# Patient Record
Sex: Female | Born: 1965 | Race: White | Hispanic: No | Marital: Single | Smoking: Current every day smoker
Health system: Southern US, Community
[De-identification: ages and names within clinical notes are randomized; demographics above are authoritative.]

## PROBLEM LIST (undated history)

## (undated) DIAGNOSIS — E785 Hyperlipidemia, unspecified: Secondary | ICD-10-CM

## (undated) DIAGNOSIS — K219 Gastro-esophageal reflux disease without esophagitis: Secondary | ICD-10-CM

## (undated) DIAGNOSIS — Z87442 Personal history of urinary calculi: Secondary | ICD-10-CM

## (undated) DIAGNOSIS — F419 Anxiety disorder, unspecified: Secondary | ICD-10-CM

## (undated) DIAGNOSIS — Z8489 Family history of other specified conditions: Secondary | ICD-10-CM

## (undated) HISTORY — DX: Anxiety disorder, unspecified: F41.9

## (undated) HISTORY — PX: OTHER SURGICAL HISTORY: SHX169

## (undated) HISTORY — DX: Gastro-esophageal reflux disease without esophagitis: K21.9

## (undated) HISTORY — PX: BUNIONECTOMY: SHX129

---

## 1998-04-07 ENCOUNTER — Emergency Department (HOSPITAL_COMMUNITY): Admission: EM | Admit: 1998-04-07 | Discharge: 1998-04-07 | Payer: Self-pay | Admitting: Emergency Medicine

## 1998-04-24 ENCOUNTER — Ambulatory Visit (HOSPITAL_COMMUNITY): Admission: RE | Admit: 1998-04-24 | Discharge: 1998-04-24 | Payer: Self-pay | Admitting: Urology

## 1998-04-24 ENCOUNTER — Encounter: Payer: Self-pay | Admitting: Urology

## 2001-04-20 ENCOUNTER — Encounter: Admission: RE | Admit: 2001-04-20 | Discharge: 2001-04-20 | Payer: Self-pay | Admitting: Family Medicine

## 2001-04-20 ENCOUNTER — Encounter: Payer: Self-pay | Admitting: Family Medicine

## 2001-07-26 HISTORY — PX: BREAST BIOPSY: SHX20

## 2008-03-06 ENCOUNTER — Ambulatory Visit (HOSPITAL_COMMUNITY): Admission: RE | Admit: 2008-03-06 | Discharge: 2008-03-06 | Payer: Self-pay | Admitting: Family Medicine

## 2010-08-17 ENCOUNTER — Encounter: Payer: Self-pay | Admitting: Family Medicine

## 2012-12-24 ENCOUNTER — Other Ambulatory Visit: Payer: Self-pay | Admitting: Physician Assistant

## 2012-12-25 NOTE — Telephone Encounter (Signed)
Medication refilled per protocol.Patient needs to be seen before any further refills  Tried to call pt, unable to reach

## 2013-02-21 ENCOUNTER — Encounter: Payer: Self-pay | Admitting: Physician Assistant

## 2013-02-21 ENCOUNTER — Ambulatory Visit (INDEPENDENT_AMBULATORY_CARE_PROVIDER_SITE_OTHER): Payer: BC Managed Care – PPO | Admitting: Physician Assistant

## 2013-02-21 VITALS — BP 106/70 | HR 76 | Temp 98.2°F | Resp 18 | Ht 63.25 in | Wt 152.0 lb

## 2013-02-21 DIAGNOSIS — F419 Anxiety disorder, unspecified: Secondary | ICD-10-CM | POA: Insufficient documentation

## 2013-02-21 DIAGNOSIS — F411 Generalized anxiety disorder: Secondary | ICD-10-CM

## 2013-02-21 DIAGNOSIS — K219 Gastro-esophageal reflux disease without esophagitis: Secondary | ICD-10-CM | POA: Insufficient documentation

## 2013-02-21 MED ORDER — ESCITALOPRAM OXALATE 10 MG PO TABS: 10.0000 mg | ORAL_TABLET | Freq: Every day | ORAL | Status: DC

## 2013-02-21 MED ORDER — ALPRAZOLAM 0.5 MG PO TABS: 0.5000 mg | ORAL_TABLET | ORAL | Status: DC | PRN

## 2013-02-21 MED ORDER — OMEPRAZOLE 20 MG PO CPDR: 20.0000 mg | DELAYED_RELEASE_CAPSULE | Freq: Every day | ORAL | Status: DC

## 2013-02-22 NOTE — Progress Notes (Signed)
Patient ID: Isabel Hines MRN: [id number], DOB: Aug 02, 1965, 47 y.o. Date of Encounter: @DATE @  Chief Complaint:  Chief Complaint  Patient presents with  . routine check up for med refills    HPI: 47 y.o. year old white pleasant  female  presents for f/u of anxiety.   I reviewed my OV note from 10/11/2005. At that visit, we discussed that she had been on med for anxiety/depression off and on at different times throughout the years. At OV 10/11/05 started Lexapro 10mg . At f/u she was feeling much better on lexapro. Then, she went without f/u of that for a long time.  Then, when she came for OV 08/02/12 she reported she had recently gone through a divorce and moved. Had been on Lexapro for about 6 months during that time, but then had stopped it b/c did not want to be on daily med. At that OV, she said she was stable off the Lexapro. Was given xanx to use prn. (#30).  Then, she called 09/14/12 requesting Lexapro. I did prescribe it.  Today she says she called 09/14/12 because she was feeling increased anxiety/stress because of her job. Says htat with her previous job, she had migraines. Went to headache center, tried multiple treatments, etc. The only thing that ended up getting rid of her headaches was changing jobs. It was her job taht was causing her headache. She says that her job has changed recently-has added a lot more responsibility and stress. She works for [employer] but now in [employer]. There.  A co-worker left the job, that co-worker was not replaced. 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 Now her headache is present again. She would leave this job but she cant b/c she has to have the money. She will not be able to find another job making the money she makes there.  She says she called 09/14/12 b/c she "couldn't take it, couldn't sleep b/c mind racing,stresssed." I called in hte Lexapro 10mg . She is taking this Daily. This controls her anxiety very  well. She does not feel she need to increase dose. She had no adv effects.  She has had no xanax for past 2 months. Would like to have it available just in case needs it.   She has recently been having GERD. Taking Prilosec OTC with complete relief. No symptoms with med.    Past Medical History  Diagnosis Date  . Anxiety   . GERD (gastroesophageal reflux disease)      Home Meds: See attached medication section for current medication list. Any medications entered into computer today will not appear on this note's list. The medications listed below were entered prior to today. No current outpatient prescriptions on file prior to visit.   No current facility-administered medications on file prior to visit.    Allergies:  Allergies  Allergen Reactions  . Codeine Hives  . Sulfa Antibiotics Hives  . Tramadol Hives    History   Social History  . Marital Status: Married    Spouse Name: N/A    Number of Children: N/A  . Years of Education: N/A   Occupational History  . Not on file.   Social History Main Topics  . Smoking status: Not on file  . Smokeless tobacco: Not on file  . Alcohol Use: Not on file  . Drug Use: Not on file  . Sexually Active: Not on file   Other Topics Concern  .  Not on file   Social History Narrative  . No narrative on file    History reviewed. No pertinent family history.   Review of Systems:  See HPI for pertinent ROS. All other ROS negative.    Physical Exam: Blood pressure 106/70, pulse 76, temperature 98.2 F (36.8 C), temperature source Oral, resp. rate 18, height 5' 3.25" (1.607 m), weight 152 lb (68.947 kg)., Body mass index is 26.7 kg/(m^2). General: WNWD WF. Pleasant. Appears in no acute distress. Lungs: Clear bilaterally to auscultation without wheezes, rales, or rhonchi. Breathing is unlabored. Heart: RRR with S1 S2. No murmurs, rubs, or gallops. Abdomen: Soft, non-tender, non-distended with normoactive bowel sounds. No  hepatomegaly. No rebound/guarding. No obvious abdominal masses. Musculoskeletal:  Strength and tone normal for age. Extremities/Skin: Warm and dry.  No edema. Neuro: Alert and oriented X 3. Moves all extremities spontaneously. Gait is normal. CNII-XII grossly in tact. Psych:  Responds to questions appropriately with a normal affect.Good eye contact. Very appropriat. Not anxious during visit. Very calm/appropriate.      ASSESSMENT AND PLAN:  47 y.o. year old female with  1. Anxiety She really needs to stay on Lexapro. After review of her chart, this is recurrent over many years. I will encourage her to stay on Lexapro indefinitely. - escitalopram (LEXAPRO) 10 MG tablet; Take 1 tablet (10 mg total) by mouth daily.  Dispense: 30 tablet; Refill: 5 - ALPRAZolam (XANAX) 0.5 MG tablet; Take 1 tablet (0.5 mg total) by mouth as needed for sleep.  Dispense: 30 tablet; Refill: 0  2. GERD (gastroesophageal reflux disease) - omeprazole (PRILOSEC) 20 MG capsule; Take 1 capsule (20 mg total) by mouth daily.  Dispense: 30 capsule; Refill: 5 Discussed diet, foods to avoid. Discussed no eating/drinking within 3 hours prior to lying down.   ROV 6 months, sooner if needed.   Murray Hodgkins [place], [place], [facility] 02/22/2013 7:49 AM

## 2013-04-03 ENCOUNTER — Encounter: Payer: Self-pay | Admitting: Family Medicine

## 2013-04-03 ENCOUNTER — Ambulatory Visit (INDEPENDENT_AMBULATORY_CARE_PROVIDER_SITE_OTHER): Payer: BC Managed Care – PPO | Admitting: Family Medicine

## 2013-04-03 VITALS — BP 104/62 | HR 68 | Temp 98.3°F | Resp 14 | Ht 66.0 in | Wt 152.0 lb

## 2013-04-03 DIAGNOSIS — J019 Acute sinusitis, unspecified: Secondary | ICD-10-CM

## 2013-04-03 MED ORDER — FLUCONAZOLE 150 MG PO TABS: 150.0000 mg | ORAL_TABLET | Freq: Once | ORAL | Status: DC

## 2013-04-03 MED ORDER — AMOXICILLIN-POT CLAVULANATE 875-125 MG PO TABS: 1.0000 | ORAL_TABLET | Freq: Two times a day (BID) | ORAL | Status: DC

## 2013-04-03 NOTE — Progress Notes (Signed)
Subjective:    Patient ID: Isabel Hines, female    DOB: [birth year], 47 y.o.   MRN: [id number]  HPI Patient is a very pleasant 47 year old white female who is battling upper respiratory infection for over 2 weeks. Unfortunately over the last week she started developing fever severe pain in her right and left maxillary sinuses, postnasal drip, sinus pain, sinus pressure, and sinus headaches. She is also having a scratchy throat due to postnasal drip and a cough productive of yellow and green sputum. Past Medical History  Diagnosis Date  . Anxiety   . GERD (gastroesophageal reflux disease)    No past surgical history on file. Current Outpatient Prescriptions on File Prior to Visit  Medication Sig Dispense Refill  . ALPRAZolam (XANAX) 0.5 MG tablet Take 1 tablet (0.5 mg total) by mouth as needed for sleep.  30 tablet  0  . cetirizine (ZYRTEC) 10 MG tablet Take 10 mg by mouth daily.      . cholecalciferol (VITAMIN D) 1000 UNITS tablet Take 1,000 Units by mouth once a week.      . escitalopram (LEXAPRO) 10 MG tablet Take 1 tablet (10 mg total) by mouth daily.  30 tablet  5  . Multiple Vitamin (MULTIVITAMIN) tablet Take 1 tablet by mouth daily.      Marland Kitchen omeprazole (PRILOSEC) 20 MG capsule Take 1 capsule (20 mg total) by mouth daily.  30 capsule  5   No current facility-administered medications on file prior to visit.   Allergies  Allergen Reactions  . Codeine Hives  . Sulfa Antibiotics Hives  . Tramadol Hives   History   Social History  . Marital Status: Married    Spouse Name: N/A    Number of Children: N/A  . Years of Education: N/A   Occupational History  . Not on file.   Social History Main Topics  . Smoking status: Current Every Day Smoker -- 1.00 packs/day    Types: Cigarettes  . Smokeless tobacco: Not on file  . Alcohol Use: No  . Drug Use: No  . Sexual Activity: Not on file   Other Topics Concern  . Not on file   Social History Narrative  . No narrative on file       Review of Systems  All other systems reviewed and are negative.       Objective:   Physical Exam  Vitals reviewed. Constitutional: She appears well-developed and well-nourished.  HENT:  Right Ear: Tympanic membrane, external ear and ear canal normal.  Left Ear: Tympanic membrane, external ear and ear canal normal.  Nose: Mucosal edema and rhinorrhea present. Right sinus exhibits maxillary sinus tenderness and frontal sinus tenderness. Left sinus exhibits no maxillary sinus tenderness and no frontal sinus tenderness.  Mouth/Throat: Oropharynx is clear and moist. No oropharyngeal exudate.  Neck: Neck supple. No thyromegaly present.  Cardiovascular: Normal rate, regular rhythm and normal heart sounds.   No murmur heard. Pulmonary/Chest: Effort normal and breath sounds normal. No respiratory distress. She has no wheezes. She has no rales.  Abdominal: Soft. Bowel sounds are normal. She exhibits no distension. There is no tenderness. There is no rebound.  Lymphadenopathy:    She has no cervical adenopathy.          Assessment & Plan:  1. Acute rhinosinusitis The patient had a viral upper respiratory infection which has become a secondary case of rhinosinusitis.  Begin Augmentin 875 mg by mouth twice a day for 10 days. I gave patient prescription  for Diflucan 150 mg by mouth x1 if needed for a yeast infection she had one develop. She is recommended to use Sudafed and nasal saline qid for congestion. - amoxicillin-clavulanate (AUGMENTIN) 875-125 MG per tablet; Take 1 tablet by mouth 2 (two) times daily.  Dispense: 20 tablet; Refill: 0 - fluconazole (DIFLUCAN) 150 MG tablet; Take 1 tablet (150 mg total) by mouth once.  Dispense: 1 tablet; Refill: 0

## 2013-04-09 ENCOUNTER — Telehealth: Payer: Self-pay | Admitting: Family Medicine

## 2013-04-09 ENCOUNTER — Other Ambulatory Visit: Payer: Self-pay | Admitting: Family Medicine

## 2013-04-09 MED ORDER — PREDNISONE 20 MG PO TABS: ORAL_TABLET | ORAL | Status: DC

## 2013-04-09 NOTE — Telephone Encounter (Signed)
Patient aware.

## 2013-04-09 NOTE — Telephone Encounter (Signed)
Can we call out prednisone dose pack.  I will e-scribe.

## 2013-04-09 NOTE — Telephone Encounter (Signed)
Pt said that she is no better. She has tried mucinex, but it is not helping. She said that she has had this one other time and they gave her prednisone and it seemed to really help. Pt wants to know if you can send that in for her or if you can send something else in for her.

## 2013-06-06 ENCOUNTER — Ambulatory Visit (INDEPENDENT_AMBULATORY_CARE_PROVIDER_SITE_OTHER): Payer: BC Managed Care – PPO | Admitting: Physician Assistant

## 2013-06-06 ENCOUNTER — Encounter: Payer: Self-pay | Admitting: Physician Assistant

## 2013-06-06 VITALS — BP 120/72 | HR 72 | Temp 99.3°F | Resp 18 | Wt 158.0 lb

## 2013-06-06 DIAGNOSIS — F419 Anxiety disorder, unspecified: Secondary | ICD-10-CM

## 2013-06-06 DIAGNOSIS — A499 Bacterial infection, unspecified: Secondary | ICD-10-CM

## 2013-06-06 DIAGNOSIS — B9689 Other specified bacterial agents as the cause of diseases classified elsewhere: Secondary | ICD-10-CM

## 2013-06-06 DIAGNOSIS — F411 Generalized anxiety disorder: Secondary | ICD-10-CM

## 2013-06-06 DIAGNOSIS — J988 Other specified respiratory disorders: Secondary | ICD-10-CM

## 2013-06-06 MED ORDER — AZITHROMYCIN 250 MG PO TABS: ORAL_TABLET | ORAL | Status: DC

## 2013-06-06 MED ORDER — ALPRAZOLAM 0.5 MG PO TABS: 0.5000 mg | ORAL_TABLET | ORAL | Status: DC | PRN

## 2013-06-06 NOTE — Progress Notes (Signed)
Patient ID: Isabel Hines MRN: [id number], DOB: 02-05-1966, 47 y.o. Date of Encounter: 06/06/2013, 4:00 PM    Chief Complaint:  Chief Complaint  Patient presents with  . Cough, chest congestion, sinus drainage and congestion     HPI: 47 y.o. year old white female says she has been sick for over one week. Has lots of pain in her face area. Is getting out thick green mucus. Has a tickle in her throat. No chest congestion. Has had no known fevers or chills. No significant ear pain and no sore throat.     Home Meds: See attached medication section for any medications that were entered at today's visit. The computer does not put those onto this list.The following list is a list of meds entered prior to today's visit.   Current Outpatient Prescriptions on File Prior to Visit  Medication Sig Dispense Refill  . cetirizine (ZYRTEC) 10 MG tablet Take 10 mg by mouth daily.      . cholecalciferol (VITAMIN D) 1000 UNITS tablet Take 1,000 Units by mouth once a week.      . escitalopram (LEXAPRO) 10 MG tablet Take 1 tablet (10 mg total) by mouth daily.  30 tablet  5  . Multiple Vitamin (MULTIVITAMIN) tablet Take 1 tablet by mouth daily.      Marland Kitchen omeprazole (PRILOSEC) 20 MG capsule Take 1 capsule (20 mg total) by mouth daily.  30 capsule  5   No current facility-administered medications on file prior to visit.    Allergies:  Allergies  Allergen Reactions  . Augmentin [Amoxicillin-Pot Clavulanate] Itching    Also had abnormal tightness in chest but pt not sure if this was caused by Augmentin for certain, but says it felt similar to other allergic reactions that affected chest.  . Codeine Hives  . Sulfa Antibiotics Hives  . Tramadol Hives      Review of Systems: See HPI for pertinent ROS. All other ROS negative.    Physical Exam: Blood pressure 120/72, pulse 72, temperature 99.3 F (37.4 C), temperature source Oral, resp. rate 18, weight 158 lb (71.668 kg)., Body mass index is 25.51  kg/(m^2). General:  WNWD WF. Appears in no acute distress. HEENT: Normocephalic, atraumatic, eyes without discharge, sclera non-icteric, nares are without discharge. Bilateral auditory canals clear, TM's are without perforation, pearly grey and translucent with reflective cone of light bilaterally. Oral cavity moist, posterior pharynx without exudate, erythema, peritonsillar abscess. Minimal tenderness with percussion of frontal and maxillary sinuses.  Neck: Supple. No thyromegaly. No lymphadenopathy. Lungs: Clear bilaterally to auscultation without wheezes, rales, or rhonchi. Breathing is unlabored. Heart: Regular rhythm. No murmurs, rubs, or gallops. Msk:  Strength and tone normal for age. Extremities/Skin: Warm and dry. No clubbing or cyanosis. No edema. No rashes or suspicious lesions. Neuro: Alert and oriented X 3. Moves all extremities spontaneously. Gait is normal. CNII-XII grossly in tact. Psych:  Responds to questions appropriately with a normal affect.     ASSESSMENT AND PLAN:  47 y.o. year old female with  1. Bacterial respiratory infection She has had reaction with Augmentin in the past so we'll avoid this. Complete all of the azithromycin. Recommend over-the-counter antitussives/cough suppressant. Also over-the-counter decongestant. Followup if symptoms do not resolve after completion of medication. - azithromycin (ZITHROMAX) 250 MG tablet; Day 1: Take 2 daily.   Days 2-5: Take one daily.  Dispense: 6 tablet; Refill: 0  2. Anxiety She requests a refill on her Xanax. This is she only has about 3  pills remaining. She usually just takes a half of a pill at a time and only needs it about once per week. She has gone to a divorce and is having a lot of stress. Says this is improving and her Lexapro is controlling this otherwise. - ALPRAZolam (XANAX) 0.5 MG tablet; Take 1 tablet (0.5 mg total) by mouth as needed for sleep.  Dispense: 30 tablet; Refill: 1   Signed, [address] [place], [place],  [facility] 06/06/2013 4:00 PM

## 2013-06-06 NOTE — Addendum Note (Signed)
Addended by: Legrand Rams B on: 06/06/2013 04:14 PM   Modules accepted: Orders

## 2013-09-03 ENCOUNTER — Telehealth: Payer: Self-pay | Admitting: Family Medicine

## 2013-09-03 DIAGNOSIS — Z20828 Contact with and (suspected) exposure to other viral communicable diseases: Secondary | ICD-10-CM

## 2013-09-03 NOTE — Telephone Encounter (Signed)
Pt has had exposure to flu through work [employer](Boss and several coworkers have been dx with flu) and would like Tamiflu called in if possible.

## 2013-09-03 NOTE — Telephone Encounter (Signed)
Confirm whether she is asymptomatic or not. If asymptomatic and the dosing is: Tamiflu 75mg  one po QD x 10 days # 10 + 0

## 2013-09-04 MED ORDER — OSELTAMIVIR PHOSPHATE 75 MG PO CAPS: 75.0000 mg | ORAL_CAPSULE | Freq: Every day | ORAL | Status: DC

## 2013-09-04 NOTE — Telephone Encounter (Signed)
Even if she is asymptomatic, she can go ahead and take the Preventive dose, which is what I have typed out for the daily x10 days. If she waits and calls us when she has symptoms then that is the treatment dose, which is twice a day for 5 days

## 2013-09-04 NOTE — Telephone Encounter (Signed)
Not symptomatic at this time.  Just has been exposed.  Told if become symptomatic to call us ASAP and we can call Tamiflu in for her.  Important to catch in the first 24-48 hours

## 2013-09-04 NOTE — Telephone Encounter (Signed)
RX sent to pharmacy.  Left patient message.

## 2013-09-05 ENCOUNTER — Ambulatory Visit: Payer: BC Managed Care – PPO | Admitting: Physician Assistant

## 2013-09-19 ENCOUNTER — Ambulatory Visit (INDEPENDENT_AMBULATORY_CARE_PROVIDER_SITE_OTHER): Payer: BC Managed Care – PPO | Admitting: Physician Assistant

## 2013-09-19 ENCOUNTER — Encounter: Payer: Self-pay | Admitting: Physician Assistant

## 2013-09-19 VITALS — BP 114/70 | HR 68 | Temp 97.1°F | Resp 18 | Ht 63.5 in | Wt 164.0 lb

## 2013-09-19 DIAGNOSIS — B9689 Other specified bacterial agents as the cause of diseases classified elsewhere: Secondary | ICD-10-CM

## 2013-09-19 DIAGNOSIS — J45901 Unspecified asthma with (acute) exacerbation: Secondary | ICD-10-CM

## 2013-09-19 DIAGNOSIS — A499 Bacterial infection, unspecified: Secondary | ICD-10-CM

## 2013-09-19 DIAGNOSIS — J988 Other specified respiratory disorders: Secondary | ICD-10-CM

## 2013-09-19 MED ORDER — GUAIFENESIN-CODEINE 100-10 MG/5ML PO SYRP
5.0000 mL | ORAL_SOLUTION | Freq: Three times a day (TID) | ORAL | Status: DC | PRN
Start: 2013-09-19 — End: 2014-04-11

## 2013-09-19 MED ORDER — ALBUTEROL SULFATE HFA 108 (90 BASE) MCG/ACT IN AERS: 2.0000 | INHALATION_SPRAY | Freq: Four times a day (QID) | RESPIRATORY_TRACT | Status: DC | PRN

## 2013-09-19 MED ORDER — AZITHROMYCIN 250 MG PO TABS: ORAL_TABLET | ORAL | Status: DC

## 2013-09-19 NOTE — Progress Notes (Signed)
Patient ID: Isabel Hines MRN: [id number], DOB: [birth year], 48 y.o. Date of Encounter: 09/19/2013, 4:04 PM    Chief Complaint:  Chief Complaint  Patient presents with  . sick x 1 week    harsh cough, body aches, congestion, just came off Tamiflu     HPI: 48 y.o. year old white female reports that her boss and 3 coworkers recently had the flu. Therefore patient took Tamiflu as preventive. Her current symptoms started last Thursday which was 09/13/13. Started with nasal congestion. Then developed cough with a lot of tickle in her throat. Since then has developed increased amount of head and chest congestion. is only getting 2 or 3 hours of sleep secondary to cough. The other day a coworker gave her a dose of Cheratussin to get her to stop coughing. Patient states this worked very well and caused no side effects. Is requesting a prescription of this because otherwise she cannot stop coughing secondary to repeated tickle in her throat. Reports that she is allergic to cats. States that coworker has recently had a cat  At the office (??weird!!)-- says her chest has felt a little bit tight like it had in the past when she has had problems with allergies and asthma. Says in the past she had an inhaler to use as needed and needs a new prescription for this.     Home Meds: See attached medication section for any medications that were entered at today's visit. The computer does not put those onto this list.The following list is a list of meds entered prior to today's visit.   Current Outpatient Prescriptions on File Prior to Visit  Medication Sig Dispense Refill  . ALPRAZolam (XANAX) 0.5 MG tablet Take 1 tablet (0.5 mg total) by mouth as needed for sleep.  30 tablet  1  . cetirizine (ZYRTEC) 10 MG tablet Take 10 mg by mouth daily.      . cholecalciferol (VITAMIN D) 1000 UNITS tablet Take 1,000 Units by mouth once a week.      . escitalopram (LEXAPRO) 10 MG tablet Take 1 tablet (10 mg total) by  mouth daily.  30 tablet  5  . Multiple Vitamin (MULTIVITAMIN) tablet Take 1 tablet by mouth daily.      Marland Kitchen omeprazole (PRILOSEC) 20 MG capsule Take 1 capsule (20 mg total) by mouth daily.  30 capsule  5   No current facility-administered medications on file prior to visit.    Allergies:  Allergies  Allergen Reactions  . Augmentin [Amoxicillin-Pot Clavulanate] Itching    Also had abnormal tightness in chest but pt not sure if this was caused by Augmentin for certain, but says it felt similar to other allergic reactions that affected chest.  . Codeine Hives  . Sulfa Antibiotics Hives  . Tramadol Hives      Review of Systems: See HPI for pertinent ROS. All other ROS negative.    Physical Exam: Blood pressure 114/70, pulse 68, temperature 97.1 F (36.2 C), temperature source Oral, resp. rate 18, height 5' 3.5" (1.613 m), weight 164 lb (74.39 kg)., Body mass index is 28.59 kg/(m^2). General:  WNWD WF. Appears in no acute distress. HEENT: Normocephalic, atraumatic, eyes without discharge, sclera non-icteric, nares are without discharge. Bilateral auditory canals clear, TM's are without perforation, pearly grey and translucent with reflective cone of light bilaterally. Oral cavity moist, posterior pharynx without exudate, erythema, peritonsillar abscess. No tenderness of frontal or maxillary sinuses with percussion.  Neck: Supple. No thyromegaly. No lymphadenopathy.  Lungs: Clear bilaterally to auscultation without wheezes, rales, or rhonchi. Breathing is unlabored. Currently lungs are clear with no wheezes. Heart: Regular rhythm. No murmurs, rubs, or gallops. Msk:  Strength and tone normal for age. Extremities/Skin: Warm and dry. Neuro: Alert and oriented X 3. Moves all extremities spontaneously. Gait is normal. CNII-XII grossly in tact. Psych:  Responds to questions appropriately with a normal affect.     ASSESSMENT AND PLAN:  48 y.o. year old female with  1. Bacterial respiratory  infection - azithromycin (ZITHROMAX) 250 MG tablet; Day 1: Take 2 daily. Days 2-5: Take 1 daily  Dispense: 6 tablet; Refill: 0 - guaiFENesin-codeine (CHERATUSSIN AC) 100-10 MG/5ML syrup; Take 5 mLs by mouth 3 (three) times daily as needed for cough.  Dispense: 120 mL; Refill: 0  2. Asthma with acute exacerbation - albuterol (PROVENTIL HFA;VENTOLIN HFA) 108 (90 BASE) MCG/ACT inhaler; Inhale 2 puffs into the lungs every 6 (six) hours as needed for wheezing or shortness of breath.  Dispense: 1 Inhaler; Refill: 0  Followup is symptoms worsen significantly or if symptoms do not resolve within one week after completion of antibiotic. I noted that her allergy list includes codeine and noted that the Cheratussin includes codeine. Discussed this with the patient. As stated in history of present illness, she recently took this and had no adverse effects. She is aware that it does involve codeine and if it causes any adverse affects to stop it immediately.   Murray HodgkinsSigned, [place], [place], [facility] 09/19/2013 4:04 PM

## 2013-09-21 ENCOUNTER — Other Ambulatory Visit: Payer: Self-pay | Admitting: Physician Assistant

## 2013-09-21 NOTE — Telephone Encounter (Signed)
Medication refilled per protocol. 

## 2013-10-20 ENCOUNTER — Other Ambulatory Visit: Payer: Self-pay | Admitting: Physician Assistant

## 2013-10-22 NOTE — Telephone Encounter (Signed)
Medication refilled per protocol. 

## 2014-02-01 ENCOUNTER — Telehealth: Payer: Self-pay | Admitting: Physician Assistant

## 2014-02-01 DIAGNOSIS — F419 Anxiety disorder, unspecified: Secondary | ICD-10-CM

## 2014-02-01 NOTE — Telephone Encounter (Signed)
Forward to PCP.

## 2014-02-01 NOTE — Telephone Encounter (Signed)
Ok to refill??  Last office visit 09/19/2013.  Last refill 05/30/2013.

## 2014-02-01 NOTE — Telephone Encounter (Signed)
Call back number is [phone] Pharmacy [facility] [address] [place]  Pt is needing a refill on ALPRAZolam Prudy Feeler(XANAX) 0.5 MG tablet Pt is also wanting to know if she could possibly be put on singular for her allergies she tried one of her sisters and it worked great.

## 2014-02-02 MED ORDER — ALPRAZOLAM 0.5 MG PO TABS: 0.5000 mg | ORAL_TABLET | ORAL | Status: DC | PRN

## 2014-02-02 MED ORDER — MONTELUKAST SODIUM 10 MG PO TABS: 10.0000 mg | ORAL_TABLET | Freq: Every day | ORAL | Status: DC

## 2014-02-02 NOTE — Telephone Encounter (Signed)
Prescription sent to pharmacy.   Medication called to pharmacy.

## 2014-02-02 NOTE — Telephone Encounter (Signed)
Xanax approved for #30+0. Also may prescribe Singulair 10 mg 1 by mouth daily #30 with 5 refills

## 2014-04-02 ENCOUNTER — Other Ambulatory Visit: Payer: Self-pay | Admitting: Physician Assistant

## 2014-04-03 ENCOUNTER — Other Ambulatory Visit: Payer: BC Managed Care – PPO

## 2014-04-03 DIAGNOSIS — Z0000 Encounter for general adult medical examination without abnormal findings: Secondary | ICD-10-CM

## 2014-04-03 LAB — CBC WITH DIFFERENTIAL/PLATELET
BASOS ABS: 0 10*3/uL (ref 0.0–0.1)
BASOS PCT: 0 % (ref 0–1)
Eosinophils Absolute: 0.1 10*3/uL (ref 0.0–0.7)
Eosinophils Relative: 1 % (ref 0–5)
HCT: 40.5 % (ref 36.0–46.0)
Hemoglobin: 14.1 g/dL (ref 12.0–15.0)
LYMPHS PCT: 30 % (ref 12–46)
Lymphs Abs: 1.8 10*3/uL (ref 0.7–4.0)
MCH: 30.1 pg (ref 26.0–34.0)
MCHC: 34.8 g/dL (ref 30.0–36.0)
MCV: 86.5 fL (ref 78.0–100.0)
Monocytes Absolute: 0.3 10*3/uL (ref 0.1–1.0)
Monocytes Relative: 5 % (ref 3–12)
NEUTROS ABS: 3.9 10*3/uL (ref 1.7–7.7)
NEUTROS PCT: 64 % (ref 43–77)
PLATELETS: 242 10*3/uL (ref 150–400)
RBC: 4.68 MIL/uL (ref 3.87–5.11)
RDW: 14.6 % (ref 11.5–15.5)
WBC: 6.1 10*3/uL (ref 4.0–10.5)

## 2014-04-03 LAB — LIPID PANEL
CHOL/HDL RATIO: 6.4 ratio
Cholesterol: 225 mg/dL — ABNORMAL HIGH (ref 0–200)
HDL: 35 mg/dL — AB (ref >39)
LDL CALC: 128 mg/dL — AB (ref 0–99)
Triglycerides: 308 mg/dL — ABNORMAL HIGH (ref <150)
VLDL: 62 mg/dL — ABNORMAL HIGH (ref 0–40)

## 2014-04-03 LAB — COMPREHENSIVE METABOLIC PANEL
ALBUMIN: 4.4 g/dL (ref 3.5–5.2)
ALK PHOS: 75 U/L (ref 39–117)
ALT: 12 U/L (ref 0–35)
AST: 5 U/L (ref 0–37)
BUN: 8 mg/dL (ref 6–23)
CALCIUM: 9.3 mg/dL (ref 8.4–10.5)
CHLORIDE: 103 meq/L (ref 96–112)
CO2: 26 mEq/L (ref 19–32)
Creat: 0.73 mg/dL (ref 0.50–1.10)
Glucose, Bld: 86 mg/dL (ref 70–99)
POTASSIUM: 5.3 meq/L (ref 3.5–5.3)
SODIUM: 137 meq/L (ref 135–145)
Total Bilirubin: 0.4 mg/dL (ref 0.2–1.2)
Total Protein: 6.4 g/dL (ref 6.0–8.3)

## 2014-04-03 LAB — TSH: TSH: 0.586 u[IU]/mL (ref 0.350–4.500)

## 2014-04-03 NOTE — Telephone Encounter (Signed)
Tell her that she is due for office visit. Schedule visit.

## 2014-04-03 NOTE — Telephone Encounter (Signed)
?   OK to Refill  - LOV 06/06/2013

## 2014-04-05 NOTE — Telephone Encounter (Signed)
Pt aware via vm 

## 2014-04-11 ENCOUNTER — Ambulatory Visit (INDEPENDENT_AMBULATORY_CARE_PROVIDER_SITE_OTHER): Payer: BC Managed Care – PPO | Admitting: Physician Assistant

## 2014-04-11 ENCOUNTER — Encounter: Payer: Self-pay | Admitting: Physician Assistant

## 2014-04-11 VITALS — BP 120/70 | HR 72 | Temp 98.0°F | Ht 65.0 in | Wt 168.0 lb

## 2014-04-11 DIAGNOSIS — F419 Anxiety disorder, unspecified: Secondary | ICD-10-CM

## 2014-04-11 DIAGNOSIS — K219 Gastro-esophageal reflux disease without esophagitis: Secondary | ICD-10-CM

## 2014-04-11 DIAGNOSIS — Z23 Encounter for immunization: Secondary | ICD-10-CM

## 2014-04-11 DIAGNOSIS — F411 Generalized anxiety disorder: Secondary | ICD-10-CM

## 2014-04-11 DIAGNOSIS — M542 Cervicalgia: Secondary | ICD-10-CM

## 2014-04-11 DIAGNOSIS — Z0000 Encounter for general adult medical examination without abnormal findings: Secondary | ICD-10-CM

## 2014-04-11 NOTE — Progress Notes (Signed)
Patient ID: Isabel Hines MRN: [id number], DOB: 21-Dec-1965, 48 y.o. Date of Encounter: 04/11/2014,   Chief Complaint: Physical (CPE)  HPI: 48 y.o. y/o white female  here for CPE.   She states that she came in and had labs drawn last week.  She states that she just saw her gynecologist and had her Pap smear and GYN exam last week.  She states that she has 2 issues that she wanted to address with me today:  1--Says she is very frustrated with her weight and trying to lose weight. Says that in the past she lost 40 pounds with doing Weight Watchers. Says that this past May she went back to Weight Watchers and did this for one month. During that month she also used her gym equipment that she has at home.  She would do elliptical for 30 minutes and also do her aerobic video.  Was doing this for 5 days a week. Says the total time spent on exercise was 1 1/2 hours a day 5 days a week.  She had absolutely no weight loss and got frustrated so then went to the [facility]. Went to the [facility] Clinic during the month of June. Got hCG injections and followed their carb diet. Says that even with this again she lost one pound--  had no change.  Says she has done Weight Watchers before and knows what to eat and knows how to track this and was tracking every single thing that went into her mouth. She is very frustrated and wants to know if I have any suggestions or input.  She says that her father is a normal sized man. She says that her mom is large. She does say that all 7 females on her mom's family are all large.  2- she says that she has been seeing a dermatologist and at her most recent visit with the dermatologist they recommended that she have her PCP obtain an x-ray of her neck. Patient states that she has intense itching sensation on her arms. Says it will start out feeling like a feather has rubbed on her arm and she will start to scratch it but then the itching becomes more and  more intense. She has no sensation of paresthesias or tingling or numbness. No pain down the arms. Some mild discomfort in her neck at times but no significant pain in the neck. She says that she has kept a journal and has eliminated things from her diet has even tried to stay out of the sun and has tracked all types of products on her skin etc. Says that the dermatologist really does not think that it is a dermatitis.  No Other complaints today.  Says that  Lexapro is working well at controlling her anxiety.   Review of Systems: Consitutional: No fever, chills, fatigue, night sweats, lymphadenopathy.  Eyes: No visual changes, eye redness, or discharge. ENT/Mouth: No ear pain, sore throat, nasal drainage, or sinus pain. Cardiovascular: No chest pressure,heaviness, tightness or squeezing, even with exertion. No increased shortness of breath or dyspnea on exertion.No palpitations, edema, orthopnea, PND. Respiratory: No cough, hemoptysis, SOB, or wheezing. Gastrointestinal: No anorexia, dysphagia, reflux, pain, nausea, vomiting, hematemesis, diarrhea, constipation, BRBPR, or melena. Breast: No mass, nodules, bulging, or retraction. No skin changes or inflammation. No nipple discharge. No lymphadenopathy. Genitourinary: No dysuria, hematuria, incontinence, vaginal discharge, pruritis, burning, abnormal bleeding, or pain. Musculoskeletal: No decreased ROM, No joint pain or swelling. No significant pain in neck, back, or  extremities. Skin: No rash,  or concerning lesions. Neurological: No headache, dizziness, syncope, seizures, tremors, memory loss, coordination problems, or paresthesias. Psychological: No anxiety, depression, hallucinations, SI/HI. Endocrine: No polydipsia, polyphagia, polyuria, or known diabetes.No increased fatigue. No palpitations/rapid heart rate. All other systems were reviewed and are otherwise negative.  Past Medical History  Diagnosis Date  . Anxiety   . GERD  (gastroesophageal reflux disease)      Past Surgical History  Procedure Laterality Date  . Uterine ablation      Home Meds:  Outpatient Prescriptions Prior to Visit  Medication Sig Dispense Refill  . albuterol (PROVENTIL HFA;VENTOLIN HFA) 108 (90 BASE) MCG/ACT inhaler Inhale 2 puffs into the lungs every 6 (six) hours as needed for wheezing or shortness of breath.  1 Inhaler  0  . ALPRAZolam (XANAX) 0.5 MG tablet Take 1 tablet (0.5 mg total) by mouth as needed for sleep.  30 tablet  0  . cetirizine (ZYRTEC) 10 MG tablet Take 10 mg by mouth daily.      . cholecalciferol (VITAMIN D) 1000 UNITS tablet Take 1,000 Units by mouth once a week.      . escitalopram (LEXAPRO) 10 MG tablet TAKE 1 TABLET BY MOUTH EVERY DAY  30 tablet  4  . montelukast (SINGULAIR) 10 MG tablet Take 1 tablet (10 mg total) by mouth at bedtime.  30 tablet  5  . Multiple Vitamin (MULTIVITAMIN) tablet Take 1 tablet by mouth daily.      Marland Kitchen omeprazole (PRILOSEC) 20 MG capsule TAKE 1 CAPSULE BY MOUTH EVERY DAY  30 capsule  5  . azithromycin (ZITHROMAX) 250 MG tablet Day 1: Take 2 daily. Days 2-5: Take 1 daily  6 tablet  0  . guaiFENesin-codeine (CHERATUSSIN AC) 100-10 MG/5ML syrup Take 5 mLs by mouth 3 (three) times daily as needed for cough.  120 mL  0   No facility-administered medications prior to visit.    Allergies:  Allergies  Allergen Reactions  . Augmentin [Amoxicillin-Pot Clavulanate] Itching    Also had abnormal tightness in chest but pt not sure if this was caused by Augmentin for certain, but says it felt similar to other allergic reactions that affected chest.  . Codeine Hives  . Sulfa Antibiotics Hives  . Tramadol Hives    History   Social History  . Marital Status: Married    Spouse Name: N/A    Number of Children: N/A  . Years of Education: N/A   Occupational History  . Not on file.   Social History Main Topics  . Smoking status: Current Every Day Smoker -- 1.00 packs/day    Types: Cigarettes   . Smokeless tobacco: Not on file  . Alcohol Use: No  . Drug Use: No  . Sexual Activity: Not on file   Other Topics Concern  . Not on file   Social History Narrative  . No narrative on file    Family History  Problem Relation Age of Onset  . Cancer Mother   . Hyperlipidemia Father     Physical Exam: Blood pressure 120/70, pulse 72, temperature 98 F (36.7 C), temperature source Oral, height 5' 5" (1.651 m), weight 168 lb (76.204 kg)., Body mass index is 27.96 kg/(m^2). General: Well developed, well nourished, WF. Appears in no acute distress. HEENT: Normocephalic, atraumatic. Conjunctiva pink, sclera non-icteric. Pupils 2 mm constricting to 1 mm, round, regular, and equally reactive to light and accomodation. EOMI. Internal auditory canal clear. TMs with good cone of light and  without pathology. Nasal mucosa pink. Nares are without discharge. No sinus tenderness. Oral mucosa pink.  Pharynx without exudate.   Neck: Supple. Trachea midline. No thyromegaly. Full ROM. No lymphadenopathy.No Carotid Bruits. Lungs: Clear to auscultation bilaterally without wheezes, rales, or rhonchi. Breathing is of normal effort and unlabored. Cardiovascular: RRR with S1 S2. No murmurs, rubs, or gallops. Distal pulses 2+ symmetrically. No carotid or abdominal bruits. Breast: Deferred. Had at [facility] last week.  Abdomen: Soft, non-tender, non-distended with normoactive bowel sounds. No hepatosplenomegaly or masses. No rebound/guarding. No CVA tenderness. No hernias.  Genitourinary: Deferred. Had at [place] last week.  Musculoskeletal: Full range of motion and 5/5 strength throughout. No edema.  Skin: Warm and moist without erythema, ecchymosis, wounds, or rash. Neuro: A+Ox3. CN II-XII grossly intact. Moves all extremities spontaneously. Full sensation throughout. Normal gait. DTR 2+ throughout upper and lower extremities.  Psych:  Responds to questions appropriately with a normal affect.   Assessment/Plan:  48  y.o. y/o female here for CPE  1. Visit for preventive health examination  A. Screening Labs: Results for orders placed in visit on 04/03/14  CBC WITH DIFFERENTIAL      Result Value Ref Range   WBC 6.1  4.0 - 10.5 K/uL   RBC 4.68  3.87 - 5.11 MIL/uL   Hemoglobin 14.1  12.0 - 15.0 g/dL   HCT 40.5  36.0 - 46.0 %   MCV 86.5  78.0 - 100.0 fL   MCH 30.1  26.0 - 34.0 pg   MCHC 34.8  30.0 - 36.0 g/dL   RDW 14.6  11.5 - 15.5 %   Platelets 242  150 - 400 K/uL   Neutrophils Relative % 64  43 - 77 %   Neutro Abs 3.9  1.7 - 7.7 K/uL   Lymphocytes Relative 30  12 - 46 %   Lymphs Abs 1.8  0.7 - 4.0 K/uL   Monocytes Relative 5  3 - 12 %   Monocytes Absolute 0.3  0.1 - 1.0 K/uL   Eosinophils Relative 1  0 - 5 %   Eosinophils Absolute 0.1  0.0 - 0.7 K/uL   Basophils Relative 0  0 - 1 %   Basophils Absolute 0.0  0.0 - 0.1 K/uL   Smear Review Criteria for review not met    COMPREHENSIVE METABOLIC PANEL      Result Value Ref Range   Sodium 137  135 - 145 mEq/L   Potassium 5.3  3.5 - 5.3 mEq/L   Chloride 103  96 - 112 mEq/L   CO2 26  19 - 32 mEq/L   Glucose, Bld 86  70 - 99 mg/dL   BUN 8  6 - 23 mg/dL   Creat 0.73  0.50 - 1.10 mg/dL   Total Bilirubin 0.4  0.2 - 1.2 mg/dL   Alkaline Phosphatase 75  39 - 117 U/L   AST 5  0 - 37 U/L   ALT 12  0 - 35 U/L   Total Protein 6.4  6.0 - 8.3 g/dL   Albumin 4.4  3.5 - 5.2 g/dL   Calcium 9.3  8.4 - 10.5 mg/dL  LIPID PANEL      Result Value Ref Range   Cholesterol 225 (*) 0 - 200 mg/dL   Triglycerides 308 (*) <150 mg/dL   HDL 35 (*) >39 mg/dL   Total CHOL/HDL Ratio 6.4     VLDL 62 (*) 0 - 40 mg/dL   LDL Cholesterol 128 (*) 0 -  99 mg/dL  TSH      Result Value Ref Range   TSH 0.586  0.350 - 4.500 uIU/mL     B. Pap: She reports this was done at her gynecologist last week.  C. Screening Mammogram: Per Gyn.  D. DEXA/BMD: Per Gyn. Does not need until later age.   E. Colorectal Cancer Screening: No indication to start this prior to age 34.  F.  Immunizations:  Influenza: She does agree to receive influenza vaccine today. Tetanus:  She states that she has had no tetanus vaccine since she was probably age 68. He is agreeable to receive Tdap today.  Pneumococcal:  No indication to receive this until age 73.ACTUALLY, AT NEXT OV I NEED TO VERIFY WITH HER REGARDING SMOKING HISTORY--EPIC HAS MARKED AS SMOKER BUT I THINK SHE HAS QUIT---WILL UPDATE--IF SMOKER, NEEDS PNEUMOVAX 23. Zostavax:   Will discuss at age 49.  2. Anxiety Well controlled with Lexapro.  3. Gastroesophageal reflux disease, esophagitis presence not specified  4. Neck pain--See HPI - DG Cervical Spine Complete; Future  5. Weight Gain Discussed with patient that TSH is normal. Discussed with her that her current weight may just be what she has to live with regarding her genetics, hormones, aging et Ronney Asters. Encouraged her to continue healthy diet and healthy amount of exercise.  Routine office visit in 6 months or sooner if needed.  NOTE: SHE SAYS THAT SHE RECENTLY DID AUTO REFILL TO PHARMACY--ACCIDENTLLY ENTERED # FOR Duanne Moron BUT REALLY DID NOT NEED REFILL ON XANAX YET--IT WAS ACCIDENTAL ERROR   Signed, [address] [place], [place], [facility] 04/11/2014 9:07 AM

## 2014-04-23 ENCOUNTER — Emergency Department: Payer: Self-pay | Admitting: Emergency Medicine

## 2014-04-23 LAB — CBC
HCT: 40.9 % (ref 35.0–47.0)
HGB: 14 g/dL (ref 12.0–16.0)
MCH: 30.4 pg (ref 26.0–34.0)
MCHC: 34.4 g/dL (ref 32.0–36.0)
MCV: 89 fL (ref 80–100)
Platelet: 204 10*3/uL (ref 150–440)
RBC: 4.62 10*6/uL (ref 3.80–5.20)
RDW: 14.5 % (ref 11.5–14.5)
WBC: 5.5 10*3/uL (ref 3.6–11.0)

## 2014-04-23 LAB — COMPREHENSIVE METABOLIC PANEL
ANION GAP: 8 (ref 7–16)
Albumin: 3.7 g/dL (ref 3.4–5.0)
Alkaline Phosphatase: 87 U/L
BUN: 10 mg/dL (ref 7–18)
Bilirubin,Total: 0.2 mg/dL (ref 0.2–1.0)
CALCIUM: 8.3 mg/dL — AB (ref 8.5–10.1)
Chloride: 108 mmol/L — ABNORMAL HIGH (ref 98–107)
Co2: 23 mmol/L (ref 21–32)
Creatinine: 0.92 mg/dL (ref 0.60–1.30)
EGFR (African American): >60
EGFR (Non-African Amer.): >60
Glucose: 113 mg/dL — ABNORMAL HIGH (ref 65–99)
Osmolality: 277 (ref 275–301)
POTASSIUM: 3.5 mmol/L (ref 3.5–5.1)
SGOT(AST): 12 U/L — ABNORMAL LOW (ref 15–37)
SGPT (ALT): 22 U/L
Sodium: 139 mmol/L (ref 136–145)
TOTAL PROTEIN: 6.9 g/dL (ref 6.4–8.2)

## 2014-04-23 IMAGING — CR DG ABDOMEN 2V
1 series · 2 of 2 positions shown · non-contrast
Comparison: None.

CLINICAL DATA: 48-year-old female left lower quadrant pain and
bloody stool yesterday and this morning. Initial encounter.

EXAM:
ABDOMEN - 2 VIEW

[Series 1: dxr abdomen 2 v flat and erect · 0.14mm/px · 2 of 2 slices shown]
[im 1/2]
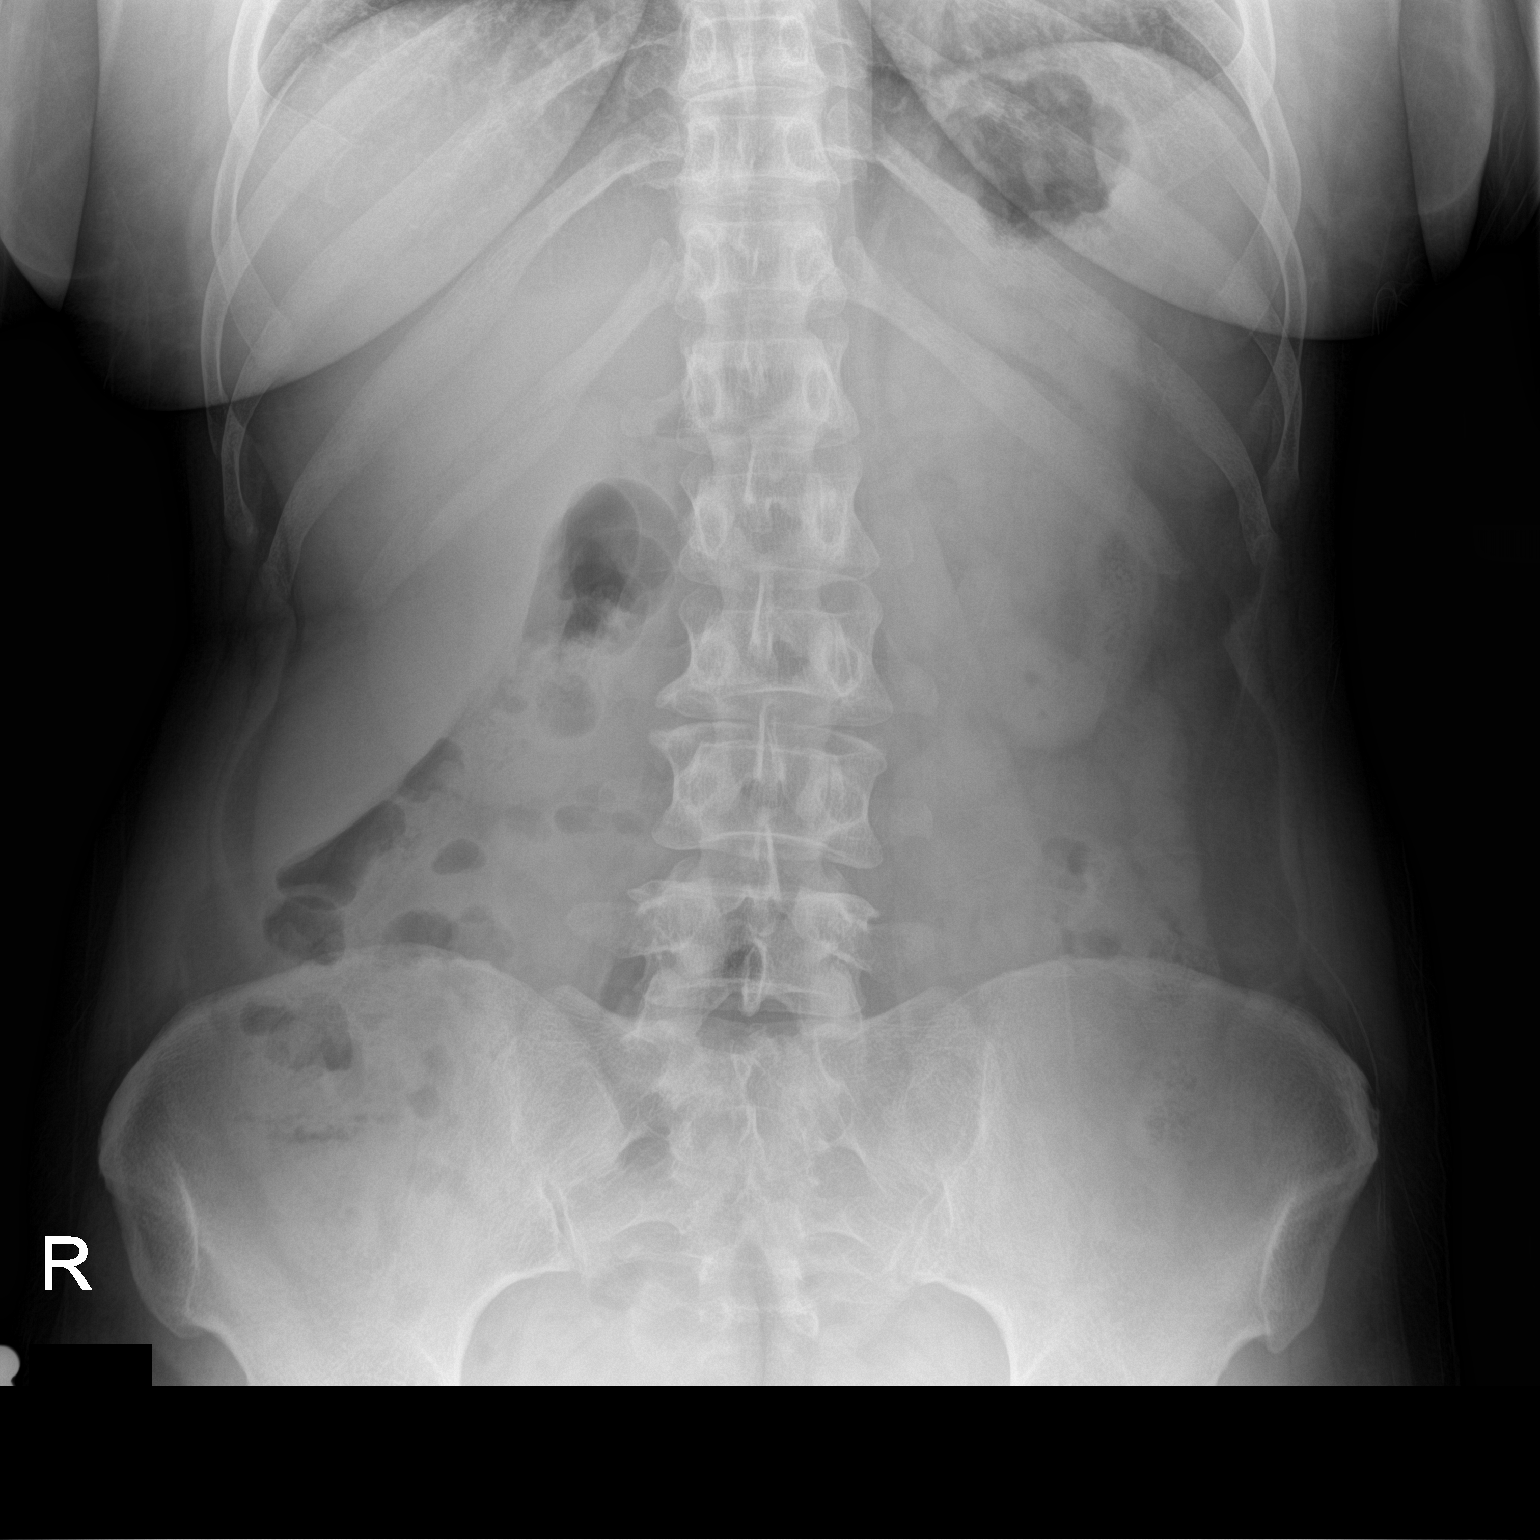
[im 2/2]
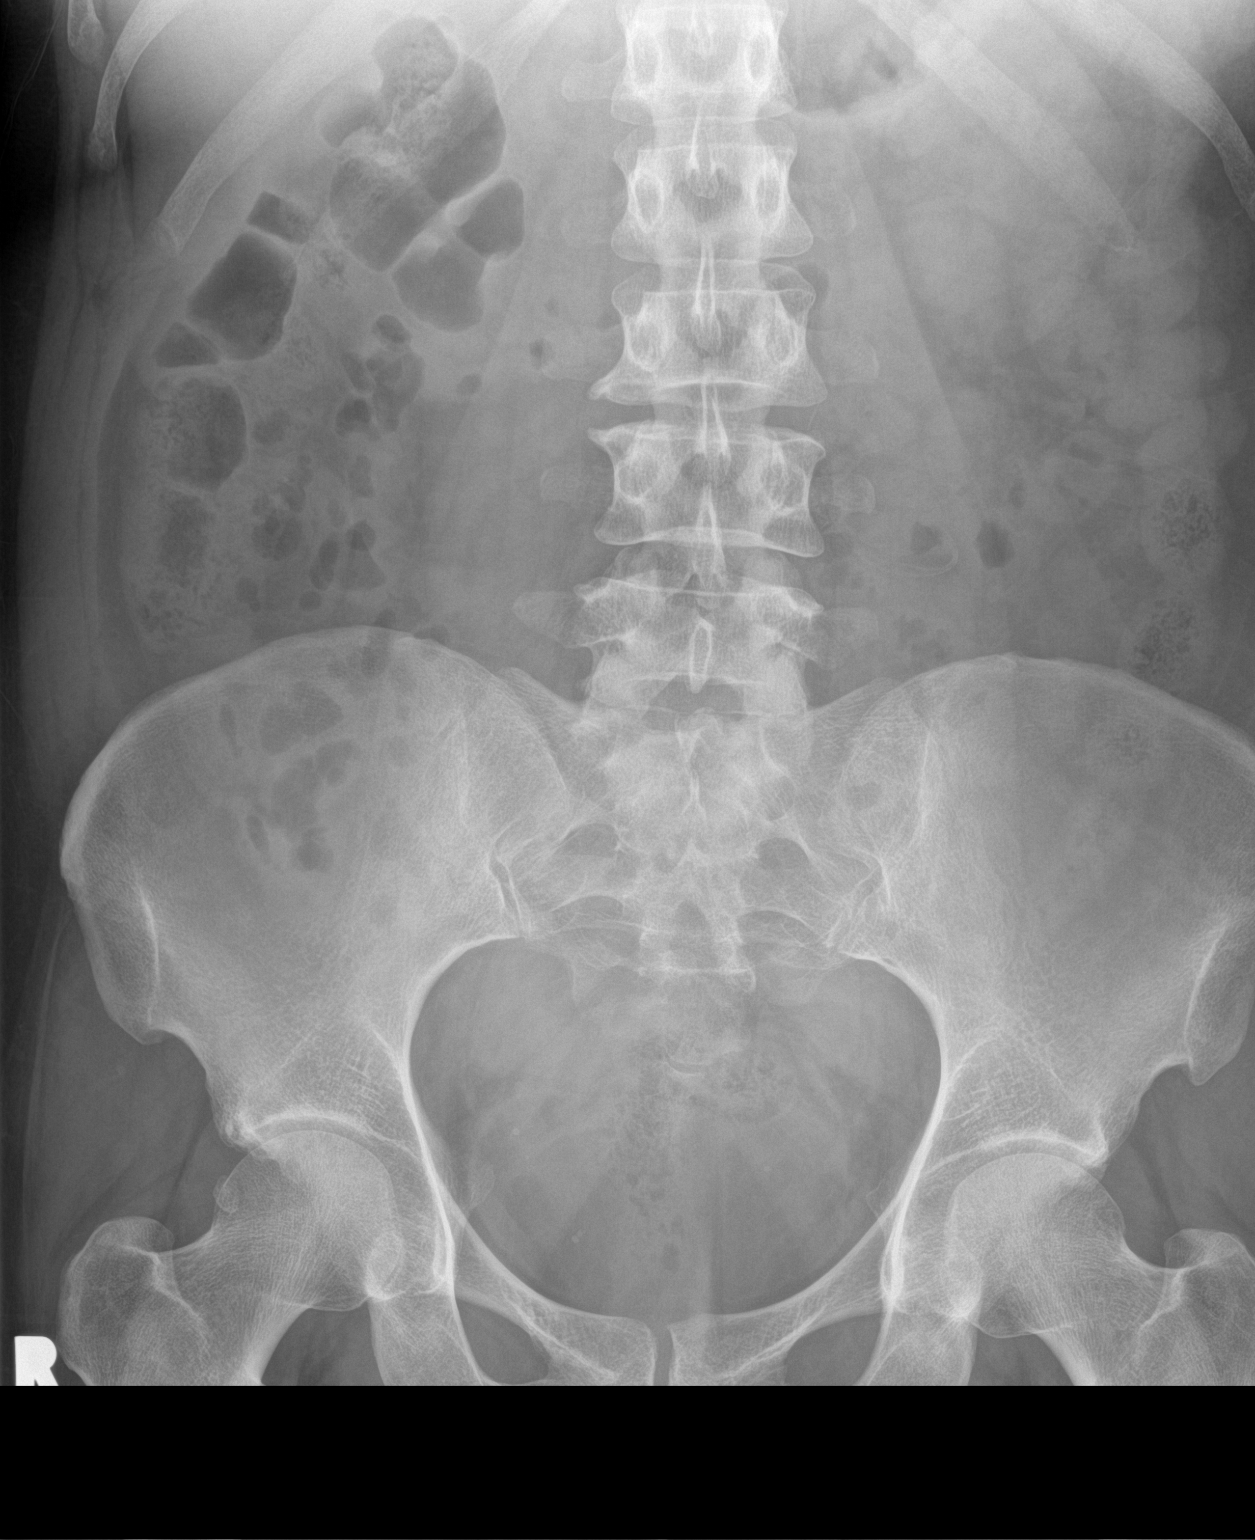

[2 of 2 positions shown; findings below may reference images not displayed]

FINDINGS: Two views of the abdomen and pelvis. Non obstructed bowel gas
pattern. No pneumoperitoneum identified. Abdominal and pelvic
visceral contours are within normal limits. No osseous abnormality
identified.
IMPRESSION: Negative radiographic appearance of the abdomen and pelvis.

## 2014-05-02 ENCOUNTER — Other Ambulatory Visit: Payer: Self-pay | Admitting: Physician Assistant

## 2014-05-03 ENCOUNTER — Telehealth: Payer: Self-pay | Admitting: *Deleted

## 2014-05-03 NOTE — Telephone Encounter (Signed)
Medication refilled per protocol. 

## 2014-05-03 NOTE — Telephone Encounter (Signed)
Contacted pt to see if she went to have her x-rays done on cervical spine and pt stated that d/t to her work schedule she can not get her xrays done, pt wants me to cancel order to have xrays done and she will just suffer with the pain. I informed pt she can go anywhere to have done as long as she lets us to know and we can send in order. Pt states she can just wait for now.  MD to be made aware.

## 2014-05-06 NOTE — Telephone Encounter (Signed)
Ok

## 2014-05-09 ENCOUNTER — Other Ambulatory Visit: Payer: Self-pay | Admitting: Physician Assistant

## 2014-05-10 NOTE — Telephone Encounter (Signed)
Refill appropriate and filled per protocol. 

## 2014-06-10 ENCOUNTER — Other Ambulatory Visit: Payer: Self-pay | Admitting: Physician Assistant

## 2014-06-10 ENCOUNTER — Encounter: Payer: Self-pay | Admitting: Family Medicine

## 2014-06-10 DIAGNOSIS — IMO0002 Reserved for concepts with insufficient information to code with codable children: Secondary | ICD-10-CM | POA: Insufficient documentation

## 2014-06-11 NOTE — Telephone Encounter (Signed)
Refill appropriate and filled per protocol. 

## 2015-04-02 ENCOUNTER — Encounter: Payer: Self-pay | Admitting: Physician Assistant

## 2015-04-02 ENCOUNTER — Ambulatory Visit (INDEPENDENT_AMBULATORY_CARE_PROVIDER_SITE_OTHER): Payer: Commercial Managed Care - HMO | Admitting: Physician Assistant

## 2015-04-02 VITALS — BP 110/76 | HR 78 | Temp 98.5°F | Resp 16 | Ht 65.0 in | Wt 173.0 lb

## 2015-04-02 DIAGNOSIS — F419 Anxiety disorder, unspecified: Secondary | ICD-10-CM | POA: Diagnosis not present

## 2015-04-02 DIAGNOSIS — F329 Major depressive disorder, single episode, unspecified: Secondary | ICD-10-CM

## 2015-04-02 DIAGNOSIS — F32A Depression, unspecified: Secondary | ICD-10-CM

## 2015-04-02 MED ORDER — BUPROPION HCL ER (SR) 150 MG PO TB12: ORAL_TABLET | ORAL | Status: DC

## 2015-04-02 MED ORDER — ALPRAZOLAM 0.5 MG PO TABS: 0.5000 mg | ORAL_TABLET | ORAL | Status: DC | PRN

## 2015-04-02 NOTE — Progress Notes (Addendum)
Patient ID: Isabel Hines MRN: [id number], DOB: 1965-08-02, 49 y.o. Date of Encounter: 04/02/2015, 10:29 AM    Chief Complaint:  Chief Complaint  Patient presents with  . Depression     HPI: 49 y.o. year old white female comes in for office visit today because she has been having symptoms of depression recently.  States that she quit Lexapro about 6 months ago because she felt that it was not working anymore.  States that her step dad is dying with cancer. She is the executor. He is in [place]. Therefore, she has been back and forth to [place] a lot.  She works as an Copywriter, advertising. Says that she has one person under her but needs to have a lot more help but they will not hire anyone else. Says that she has way too much put on her. They put more and more responsibilities on her constantly. Says that she would quit her job right now if she did not have house payments and car payments.  Says that she is crying every single day.  Says that she dreads doing anything. Feels a lump in her throat. Says it is so hard for her to do anything when she is at work.  Says that she goes to church and does her daily readings. Says that she knows she has so much to be grateful for, but, at the same time, just cannot get rid of these other feelings stated above. Says that she has even tried to go to the gym thinking that maybe exercise would help but that didn't work. Says that she just has no desire to do anything. Just wants to be alone. Says that she would never even think of suicide says that she doesn't want to commit suicide but just wants to be left alone.  Says that in the past she has been " this bad off " 2 times in the past. Says that she remembers that with one of those times, Wellbutrin worked well for her and thinks this was about 15 years ago. However she thinks that the only down fall with Wellbutrin was that she did have problems with sleep and thinks that's when the Xanax  was added was to use the Xanax for sleep. Says the Xanax "knocks her out" even at the current dose of 0.5mg  and she usually even breaks this in half and takes 0.25mg  (1/2 of 0.5mg  tab).  Wants to try the Wellbutrin again.  During our conversation she does make mention of multiple medication she has been on in the past including Lexapro Effexor Paxil.   ADDENDUM--ADDED 06/10/15; Received OV Note from [facility] dated 06/06/15; Dx: Menopausal Syndrome: "Pt reports significant improvement in menopause symptoms since cross-tapering off Wellbutrin to Citalopram" --Citalopram  QD  Home Meds:   Outpatient Prescriptions Prior to Visit  Medication Sig Dispense Refill  . albuterol (PROVENTIL HFA;VENTOLIN HFA) 108 (90 BASE) MCG/ACT inhaler Inhale 2 puffs into the lungs every 6 (six) hours as needed for wheezing or shortness of breath. 1 Inhaler 0  . cetirizine (ZYRTEC) 10 MG tablet Take 10 mg by mouth daily.    . cholecalciferol (VITAMIN D) 1000 UNITS tablet Take 1,000 Units by mouth once a week.    . montelukast (SINGULAIR) 10 MG tablet Take 1 tablet (10 mg total) by mouth at bedtime. 30 tablet 5  . Multiple Vitamin (MULTIVITAMIN) tablet Take 1 tablet by mouth daily.    Marland Kitchen omeprazole (PRILOSEC) 20 MG capsule TAKE 1 CAPSULE  BY MOUTH EVERY DAY 30 capsule 11  . ALPRAZolam (XANAX) 0.5 MG tablet Take 1 tablet (0.5 mg total) by mouth as needed for sleep. 30 tablet 0  . escitalopram (LEXAPRO) 10 MG tablet TAKE 1 TABLET BY MOUTH EVERY DAY (Patient not taking: Reported on 04/02/2015) 30 tablet 3   No facility-administered medications prior to visit.    Allergies:  Allergies  Allergen Reactions  . Augmentin [Amoxicillin-Pot Clavulanate] Itching    Also had abnormal tightness in chest but pt not sure if this was caused by Augmentin for certain, but says it felt similar to other allergic reactions that affected chest.  . Codeine Hives  . Sulfa Antibiotics Hives  . Tramadol Hives      Review of  Systems: See HPI for pertinent ROS. All other ROS negative.    Physical Exam: Blood pressure 110/76, pulse 78, temperature 98.5 F (36.9 C), temperature source Oral, resp. rate 16, height 5\' 5"  (1.651 m), weight 173 lb (78.472 kg)., Body mass index is 28.79 kg/(m^2). General: WNWD WF.  Appears in no acute distress. Neck: Supple. No thyromegaly. No lymphadenopathy. Lungs: Clear bilaterally to auscultation without wheezes, rales, or rhonchi. Breathing is unlabored. Heart: Regular rhythm. No murmurs, rubs, or gallops. Msk:  Strength and tone normal for age. Extremities/Skin: Warm and dry.  Neuro: Alert and oriented X 3. Moves all extremities spontaneously. Gait is normal. CNII-XII grossly in tact. Psych:  Responds to questions appropriately with a normal affect. She is not crying and is not tearful during our visit today. Throughout the entire visit she is extremely appropriate and normal affect.      ASSESSMENT AND PLAN:  49 y.o. year old female with  1. Depression - buPROPion (WELLBUTRIN SR) 150 MG 12 hr tablet; Take 1 daily for 5 days then increase to one twice a day.  Dispense: 30 tablet; Refill: 3  2. Anxiety - ALPRAZolam (XANAX) 0.5 MG tablet; Take 1 tablet (0.5 mg total) by mouth as needed for sleep.  Dispense: 30 tablet; Refill: 1  She is to take the Wellbutrin as directed on a routine basis. Reminded her that it will take a while for it to start to take effect and not to stop it if she just isn't feeling any difference. If she does think it's causing any adverse effects then call us. If she has problems with insomnia then she can take a half or whole at the Xanax at night if needed. Will schedule follow-up office visit in 6 weeks or sooner if needed.  ADDENDUM--ADDED 06/10/15; Received OV Note from [facility] dated 06/06/15; Dx: Menopausal Syndrome: "Pt reports significant improvement in menopause symptoms since cross-tapering off Wellbutrin to Citalopram" --Citalopram 20mg   QD   Signed, [address] [place], [place], [facility] 04/02/2015 10:29 AM

## 2015-05-15 ENCOUNTER — Ambulatory Visit: Payer: Commercial Managed Care - HMO | Admitting: Physician Assistant

## 2015-06-25 ENCOUNTER — Encounter: Payer: Self-pay | Admitting: Physician Assistant

## 2015-06-25 ENCOUNTER — Ambulatory Visit (INDEPENDENT_AMBULATORY_CARE_PROVIDER_SITE_OTHER): Payer: Commercial Managed Care - HMO | Admitting: Physician Assistant

## 2015-06-25 VITALS — BP 104/78 | HR 76 | Temp 98.9°F | Resp 18 | Wt 174.0 lb

## 2015-06-25 DIAGNOSIS — G4700 Insomnia, unspecified: Secondary | ICD-10-CM | POA: Diagnosis not present

## 2015-06-25 DIAGNOSIS — J0100 Acute maxillary sinusitis, unspecified: Secondary | ICD-10-CM | POA: Diagnosis not present

## 2015-06-25 DIAGNOSIS — F419 Anxiety disorder, unspecified: Secondary | ICD-10-CM | POA: Diagnosis not present

## 2015-06-25 DIAGNOSIS — F32A Depression, unspecified: Secondary | ICD-10-CM

## 2015-06-25 DIAGNOSIS — F329 Major depressive disorder, single episode, unspecified: Secondary | ICD-10-CM | POA: Diagnosis not present

## 2015-06-25 MED ORDER — PREDNISONE 20 MG PO TABS: ORAL_TABLET | ORAL | Status: DC

## 2015-06-25 MED ORDER — ALPRAZOLAM 0.5 MG PO TABS: 0.5000 mg | ORAL_TABLET | ORAL | Status: DC | PRN

## 2015-06-25 MED ORDER — CEFDINIR 300 MG PO CAPS: 300.0000 mg | ORAL_CAPSULE | Freq: Two times a day (BID) | ORAL | Status: DC

## 2015-06-25 NOTE — Progress Notes (Signed)
Patient ID: Isabel Hines MRN: [id number], DOB: September 22, 1965, 49 y.o. Date of Encounter: 06/25/2015, 10:26 AM    Chief Complaint:  Chief Complaint  Patient presents with  . sick x 2 weeks    sinus infection, print Rx's     HPI: 49 y.o. year old white female presents with above symptoms.  States that last week she called in to her company doctor and they prescribed her prednisone inhaler and nasal spray. States that at that time she was just having a lot of sinus pressure but now she knows that it is a sinus infection. Says that her face and head have hurt for the past 2 weeks. Feels lots of pressure. Now with thick dark mucus. Has been using that he pot and breathing steam. Says that in the beginning her chest was worse but it is better now and now feels like there is minimal congestion in her chest but definitely sinus infection.  She states that she knows in the past Z-Pak does not work for her.  Also states that when she saw her gynecologist they change the Wellbutrin to Celexa. Says that that is working well and they gave her a year worth of refills on the Celexa. Visit she is still having trouble sleeping and has to use Xanax every night for that. Is wanting to go ahead and pick up a refill on the Xanax while she is here so she does not have to come back in for another visit for that.       Home Meds:   Outpatient Prescriptions Prior to Visit  Medication Sig Dispense Refill  . albuterol (PROVENTIL HFA;VENTOLIN HFA) 108 (90 BASE) MCG/ACT inhaler Inhale 2 puffs into the lungs every 6 (six) hours as needed for wheezing or shortness of breath. 1 Inhaler 0  . cholecalciferol (VITAMIN D) 1000 UNITS tablet Take 1,000 Units by mouth once a week.    . Multiple Vitamin (MULTIVITAMIN) tablet Take 1 tablet by mouth daily.    Marland Kitchen ALPRAZolam (XANAX) 0.5 MG tablet Take 1 tablet (0.5 mg total) by mouth as needed for sleep. 30 tablet 1  . cetirizine (ZYRTEC) 10 MG tablet Take 10 mg by mouth  daily.    . montelukast (SINGULAIR) 10 MG tablet Take 1 tablet (10 mg total) by mouth at bedtime. (Patient not taking: Reported on 06/25/2015) 30 tablet 5  . omeprazole (PRILOSEC) 20 MG capsule TAKE 1 CAPSULE BY MOUTH EVERY DAY (Patient not taking: Reported on 06/25/2015) 30 capsule 11  . buPROPion (WELLBUTRIN SR) 150 MG 12 hr tablet Take 1 daily for 5 days then increase to one twice a day. (Patient not taking: Reported on 06/25/2015) 30 tablet 3   No facility-administered medications prior to visit.    Allergies:  Allergies  Allergen Reactions  . Augmentin [Amoxicillin-Pot Clavulanate] Itching    Also had abnormal tightness in chest but pt not sure if this was caused by Augmentin for certain, but says it felt similar to other allergic reactions that affected chest.  . Codeine Hives  . Sulfa Antibiotics Hives  . Tramadol Hives      Review of Systems: See HPI for pertinent ROS. All other ROS negative.    Physical Exam: Blood pressure 104/78, pulse 76, temperature 98.9 F (37.2 C), temperature source Oral, resp. rate 18, weight 174 lb (78.926 kg)., Body mass index is 28.96 kg/(m^2). General:  WNWD WF. Appears in no acute distress. HEENT: Normocephalic, atraumatic, eyes without discharge, sclera non-icteric, nares are without  discharge. Bilateral auditory canals clear, TM's are without perforation. Right TM--light pink with area of red inflamed vessels. Left TM light pink, slightly dull. Oral cavity moist, posterior pharynx without exudate, erythema, peritonsillar abscess. Positive tenderness with percussion of left maxillary sinus. Minimal tenderness with percussion of right maxillary sinus and the frontal sinuses.  Neck: Supple. No thyromegaly. No lymphadenopathy. Lungs: Clear bilaterally to auscultation without wheezes, rales, or rhonchi. Breathing is unlabored. Heart: Regular rhythm. No murmurs, rubs, or gallops. Msk:  Strength and tone normal for age. Extremities/Skin: Warm and  dry. Neuro: Alert and oriented X 3. Moves all extremities spontaneously. Gait is normal. CNII-XII grossly in tact. Psych:  Responds to questions appropriately with a normal affect.     ASSESSMENT AND PLAN:  49 y.o. year old female with  1. Acute maxillary sinusitis, recurrence not specified - cefdinir (OMNICEF) 300 MG capsule; Take 1 capsule (300 mg total) by mouth 2 (two) times daily.  Dispense: 20 capsule; Refill: 0 - predniSONE (DELTASONE) 20 MG tablet; Take 3 daily for 2 days, then 2 daily for 2 days, then 1 daily for 2 days.  Dispense: 12 tablet; Refill: 0 Take antibiotic and prednisone as directed and complete them. Follow-up if symptoms do not resolve after completion of these.  2. Anxiety Continue Celexa daily. Continue using Xanax at night. - ALPRAZolam (XANAX) 0.5 MG tablet; Take 1 tablet (0.5 mg total) by mouth as needed for sleep.  Dispense: 30 tablet; Refill: 2  3. Depression Continue Celexa daily. Continue using Xanax at night.  4. Insomnia Continue Celexa daily. Continue using Xanax at night.  Signed, [address] Suffern[address], [place], [facility] 06/25/2015 10:26 AM

## 2016-01-14 ENCOUNTER — Other Ambulatory Visit: Payer: Self-pay | Admitting: Physician Assistant

## 2016-01-14 NOTE — Telephone Encounter (Signed)
rx called in

## 2016-01-14 NOTE — Telephone Encounter (Signed)
Approved. #30+2 refills 

## 2016-01-14 NOTE — Telephone Encounter (Signed)
Ok to refill??  Last office visit/ refill 06/25/2015, #2 refills.

## 2016-03-07 ENCOUNTER — Emergency Department
Admission: EM | Admit: 2016-03-07 | Discharge: 2016-03-07 | Disposition: A | Discharge status: Home / Self Care | Payer: Commercial Managed Care - HMO | Attending: Emergency Medicine | Admitting: Emergency Medicine

## 2016-03-07 ENCOUNTER — Encounter: Payer: Self-pay | Admitting: *Deleted

## 2016-03-07 ENCOUNTER — Emergency Department: Payer: Commercial Managed Care - HMO

## 2016-03-07 DIAGNOSIS — H9201 Otalgia, right ear: Secondary | ICD-10-CM

## 2016-03-07 DIAGNOSIS — M26609 Unspecified temporomandibular joint disorder, unspecified side: Secondary | ICD-10-CM

## 2016-03-07 DIAGNOSIS — F17210 Nicotine dependence, cigarettes, uncomplicated: Secondary | ICD-10-CM | POA: Diagnosis not present

## 2016-03-07 DIAGNOSIS — M26601 Right temporomandibular joint disorder, unspecified: Secondary | ICD-10-CM | POA: Diagnosis not present

## 2016-03-07 IMAGING — CT CT MAXILLOFACIAL W/O CM
3 of 4 series · 16 of 47 positions shown, 19 images · non-contrast
Comparison: None.

CLINICAL DATA: Severe right jaw pain

EXAM:
CT MAXILLOFACIAL WITHOUT CONTRAST
TECHNIQUE: Multidetector CT imaging of the maxillofacial structures was
performed. Multiplanar CT image reconstructions were also generated.
A small metallic BB was placed on the right temple in order to
reliably differentiate right from left.

[Series 2: max soft · axial · 0.35mm/px · z∈[-226,-110]mm · 10 of 72 slices shown, 13 images]
[im 7/72  brain]
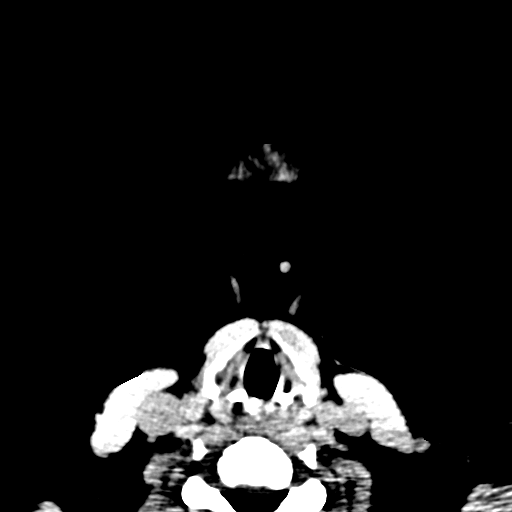
[im 7/72  bone]
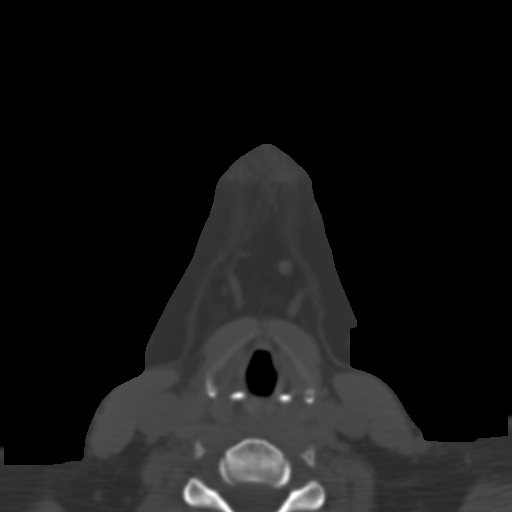
[im 13/72  bone]
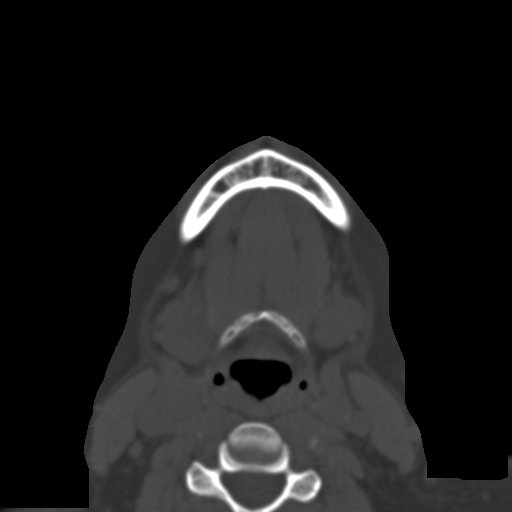
[im 20/72  bone]
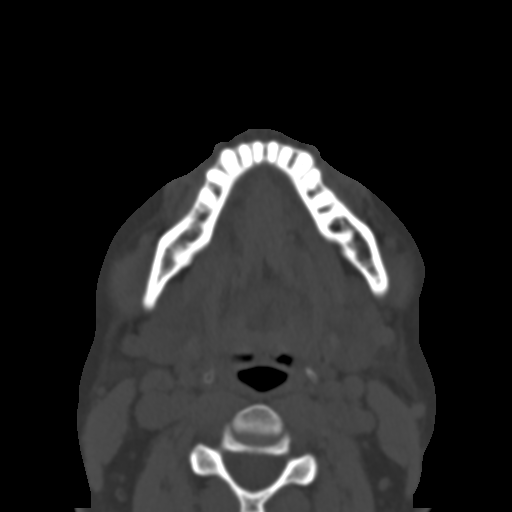
[im 26/72  bone]
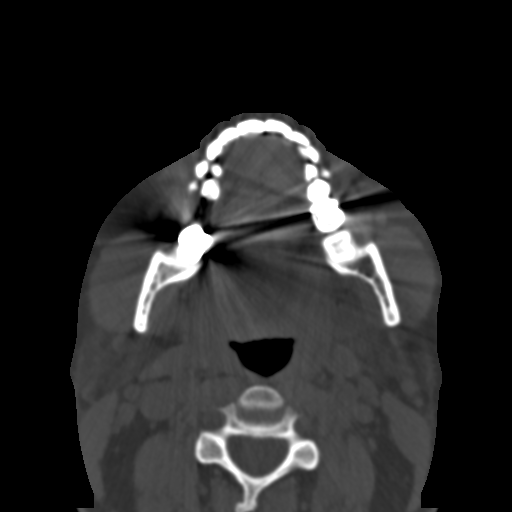
[im 33/72  brain]
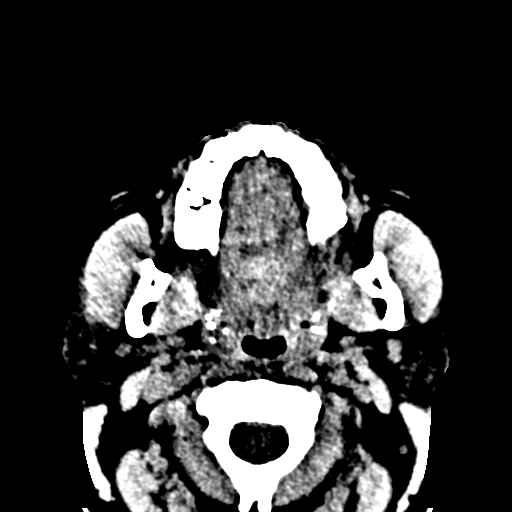
[im 33/72  bone]
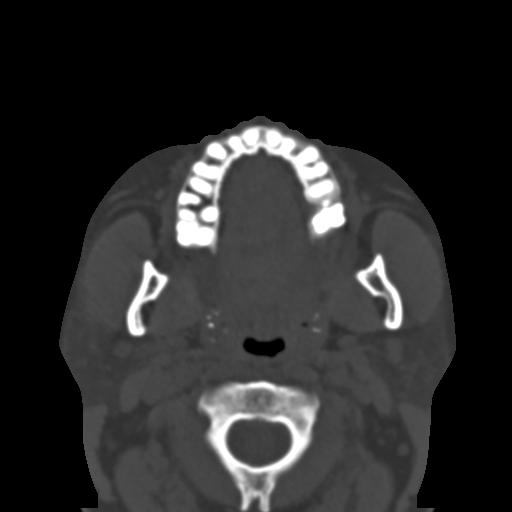
[im 39/72  bone]
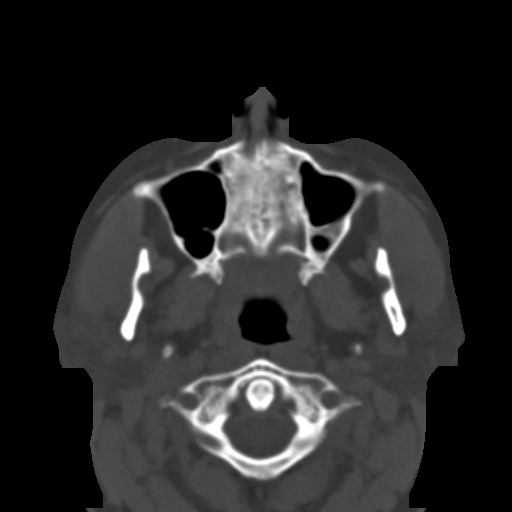
[im 46/72  bone]
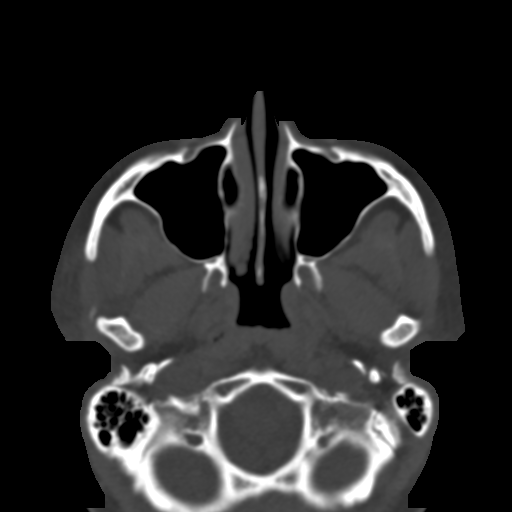
[im 52/72  bone]
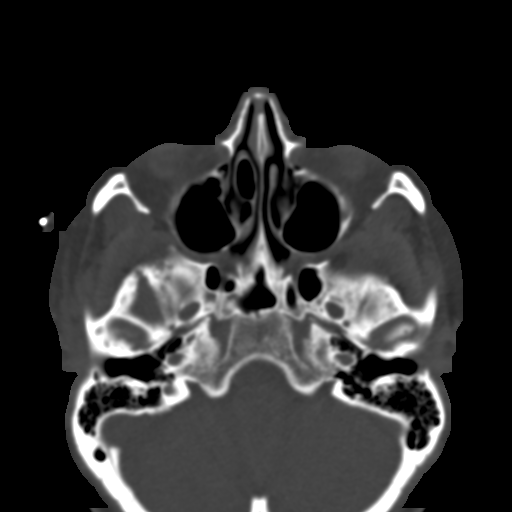
[im 59/72  brain]
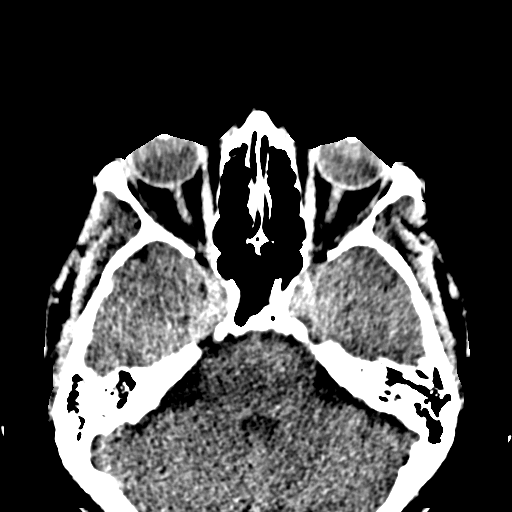
[im 59/72  bone]
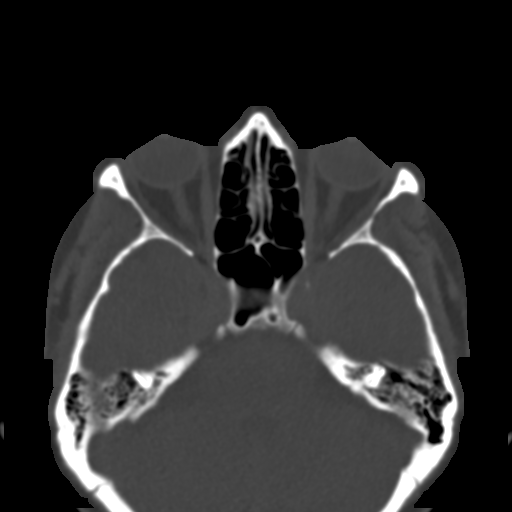
[im 65/72  bone]
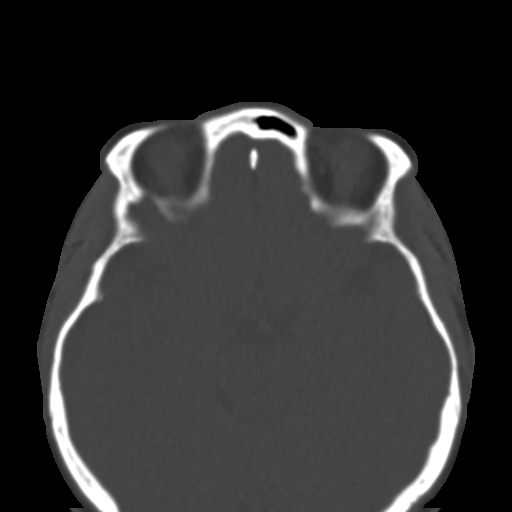

[Series 6: coronal soft · coronal · 0.33mm/px · 3 of 57 slices shown]
[im 19/57  bone]
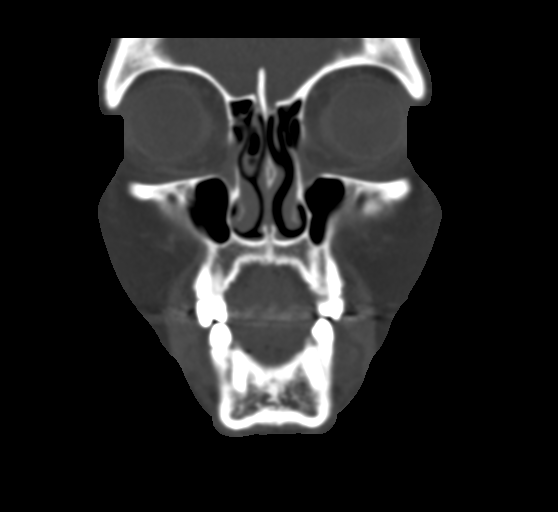
[im 25/57  bone]
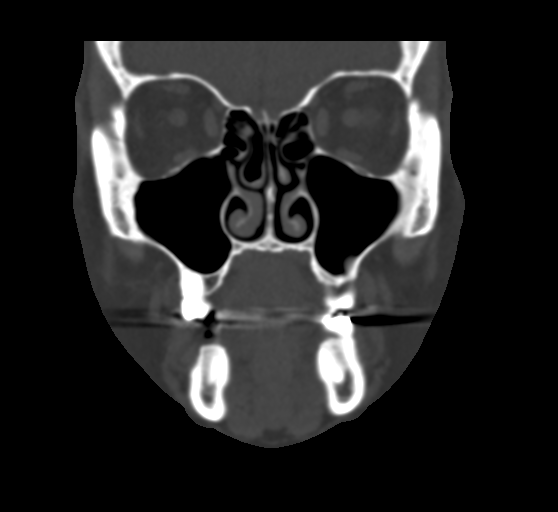
[im 32/57  bone]
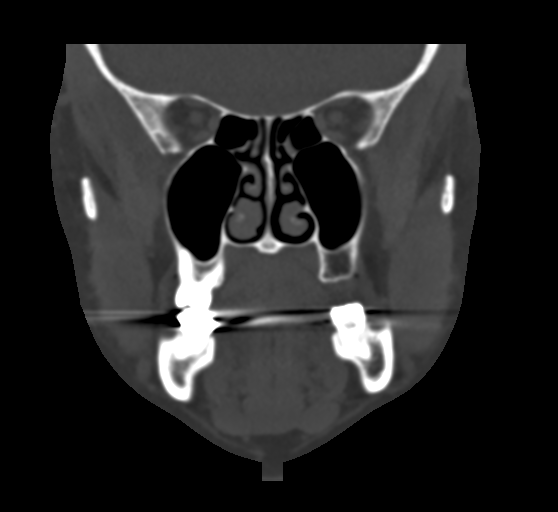

[Series 7: sagittal soft · sagittal · 0.29mm/px · 3 of 81 slices shown]
[im 27/81  bone]
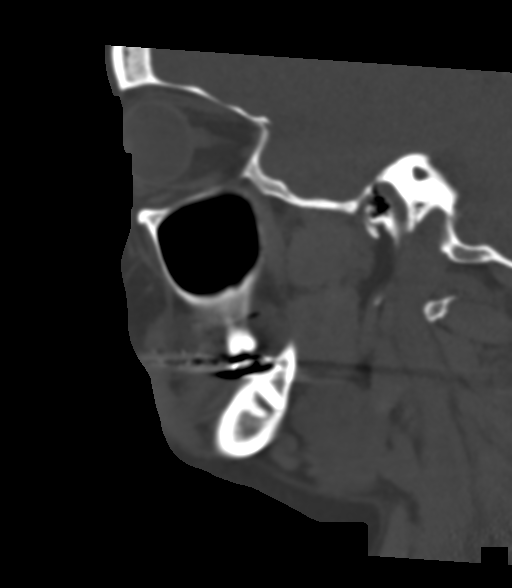
[im 41/81  bone]
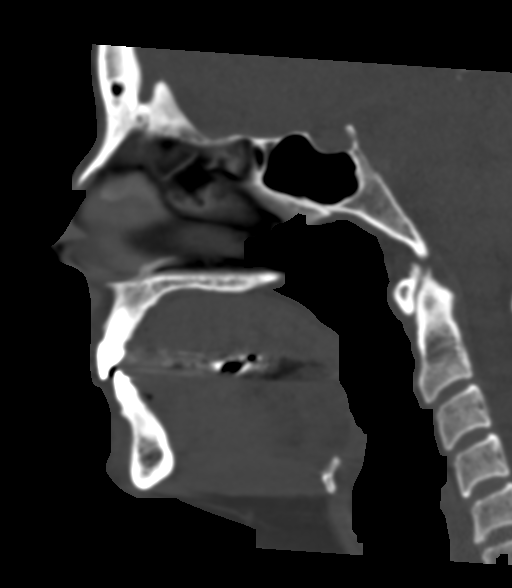
[im 54/81  bone]
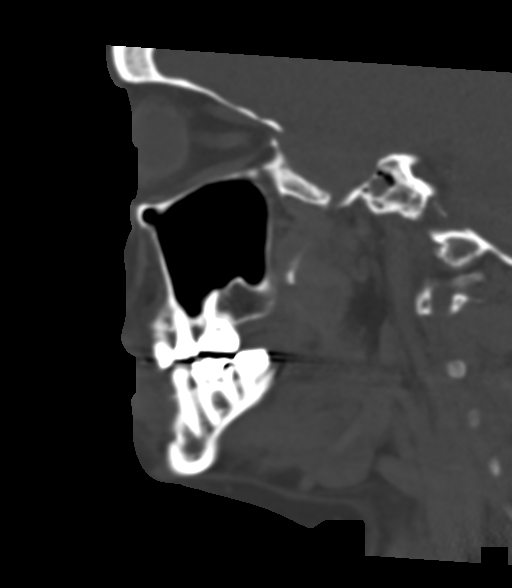

[16 of 47 positions shown; findings below may reference images not displayed]

FINDINGS: No acute fracture. No dislocation. Mastoid air cells are clear.
Paranasal sinuses are clear. There is no obvious soft tissue mass or
fluid collection about the right side of the mandible. The airway is
patent. Calcifications in the palatine tonsils are of unknown
significance. No evidence of orbital or vitreous hemorrhage.
IMPRESSION: No acute bony pathology.

## 2016-03-07 MED ORDER — NAPROXEN 500 MG PO TABS: 500.0000 mg | ORAL_TABLET | Freq: Two times a day (BID) | ORAL | 2 refills | Status: DC

## 2016-03-07 MED ORDER — AMOXICILLIN 500 MG PO CAPS: 500.0000 mg | ORAL_CAPSULE | Freq: Two times a day (BID) | ORAL | 0 refills | Status: DC

## 2016-03-07 NOTE — ED Triage Notes (Signed)
Pt reports right ear pain radiating to jaw, pt denies chest pain and any other symptoms

## 2016-03-07 NOTE — ED Provider Notes (Signed)
[facility]: [facility] Emergency Department Provider Note   ____________________________________________    I have reviewed the triage vital signs and the nursing notes.   HISTORY  Chief Complaint Otalgia    HPI Isabel Hines is a 50 y.o. female who presents with complaints of right-sided jaw pain. Patient reports it is so painful that she has difficulty opening her mouth to eat. she denies injury to the area.no fevers or chills. No dental pain. She has mild ear discomfort. No ear drainage. No headaches. No neuro deficits. No history of tic douloureux. No difficulty swallowing, no intraoral swelling   Past Medical History:  Diagnosis Date  . Anxiety   . GERD (gastroesophageal reflux disease)     Patient Active Problem List   Diagnosis Date Noted  . Insomnia 06/25/2015  . Depression 04/02/2015  . Abnormal cervical Pap smear with positive HPV DNA test 06/10/2014  . Anxiety   . GERD (gastroesophageal reflux disease)     Past Surgical History:  Procedure Laterality Date  . uterine ablation      Prior to Admission medications   Medication Sig Start Date End Date Taking? Authorizing Provider  albuterol (PROVENTIL HFA;VENTOLIN HFA) 108 (90 BASE) MCG/ACT inhaler Inhale 2 puffs into the lungs every 6 (six) hours as needed for wheezing or shortness of breath. 09/19/13   Dorena BodoMary B Wahlen, PA-C  ALPRAZolam Prudy Feeler(XANAX) 0.5 MG tablet TAKE 1 TABLET BY MOUTH AS NEEDED FOR SLEEP 01/14/16   Dorena BodoMary B Mullan, PA-C  amoxicillin (AMOXIL) 500 MG capsule Take 1 capsule (500 mg total) by mouth 2 (two) times daily. 03/07/16   Jene Everyobert Audwin Semper, MD  cefdinir (OMNICEF) 300 MG capsule Take 1 capsule (300 mg total) by mouth 2 (two) times daily. 06/25/15   Patriciaann ClanMary B Choplin, PA-C  cetirizine (ZYRTEC) 10 MG tablet Take 10 mg by mouth daily.    Historical Provider, MD  cholecalciferol (VITAMIN D) 1000 UNITS tablet Take 1,000 Units by mouth once a week.    Historical Provider, MD  citalopram (CELEXA) 20  MG tablet TK 1 T PO QD 06/01/15   Historical Provider, MD  ipratropium (ATROVENT) 0.06 % nasal spray U 2 SPRAYS IEN TID 06/20/15   Historical Provider, MD  montelukast (SINGULAIR) 10 MG tablet Take 1 tablet (10 mg total) by mouth at bedtime. Patient not taking: Reported on 06/25/2015 02/02/14   Dorena BodoMary B Boxley, PA-C  Multiple Vitamin (MULTIVITAMIN) tablet Take 1 tablet by mouth daily.    Historical Provider, MD  omeprazole (PRILOSEC) 20 MG capsule TAKE 1 CAPSULE BY MOUTH EVERY DAY Patient not taking: Reported on 06/25/2015 05/03/14   Dorena BodoMary B Mcmartin, PA-C  predniSONE (DELTASONE) 20 MG tablet Take 3 daily for 2 days, then 2 daily for 2 days, then 1 daily for 2 days. 06/25/15   Dorena BodoMary B Jon, PA-C     Allergies Codeine; Hydrocodone; Sulfa antibiotics; and Tramadol  Family History  Problem Relation Age of Onset  . Cancer Mother   . Hyperlipidemia Father     Social History Social History  Substance Use Topics  . Smoking status: Current Every Day Smoker    Packs/day: 1.00    Types: Cigarettes  . Smokeless tobacco: Never Used  . Alcohol use No    Review of Systems  Constitutional: No fever/chills  ENT: No sore throat.     Musculoskeletal: right-sided jaw pain as above Skin: Negative for rash. Neurological: Negative for headaches     ____________________________________________   PHYSICAL EXAM:  VITAL SIGNS:  ED Triage Vitals  Enc Vitals Group     BP 03/07/16 0735 127/66     Pulse Rate 03/07/16 0735 93     Resp 03/07/16 0735 20     Temp 03/07/16 0735 98.3 F (36.8 C)     Temp Source 03/07/16 0735 Oral     SpO2 03/07/16 0735 97 %     Weight 03/07/16 0737 175 lb (79.4 kg)     Height 03/07/16 0737  (1.651 m)     Head Circumference --      Peak Flow --      Pain Score 03/07/16 0734 8     Pain Loc --      Pain Edu? --      Excl. in GC? --     Constitutional: Alert and oriented. No acute distress. Pleasant and interactive Eyes: Conjunctivae are normal.  Head:  Atraumatic. Nose: No congestion/rhinnorhea. Mouth/Throat: Mucous membranes are moist.  No dental tenderness, no intraoral swelling, no evidence of infection. Tender to palpation over right TMJ although no swelling fluctuance or erythema. TM is unremarkable. Patient is unable to open mouth secondary to pain greater than approximately 1 inch Cardiovascular: Normal rate, regular rhythm.  Respiratory: Normal respiratory effort.  No retractions. Genitourinary: deferred Musculoskeletal: No lower extremity tenderness nor edema.   Neurologic:  Normal speech and language. No gross focal neurologic deficits are appreciated.   Skin:  Skin is warm, dry and intact. No rash noted.   ____________________________________________   LABS (all labs ordered are listed, but only abnormal results are displayed)  Labs Reviewed - No data to display ____________________________________________  EKG   ____________________________________________  RADIOLOGY  CT scan unremarkable ____________________________________________   PROCEDURES  Procedure(s) performed: No    Critical Care performed: No ____________________________________________   INITIAL IMPRESSION / ASSESSMENT AND PLAN / ED COURSE  Pertinent labs & imaging results that were available during my care of the patient were reviewed by me and considered in my medical decision making (see chart for details).  Patient with right-sided TMJ pain but no evidence of infection and I do not suspect traumatic injury. Patient is quite concerned about this we will obtain imaging and reevaluate.  CT scan unremarkable, we'll provide analgesics and outpatient follow-up with her PCP ____________________________________________   FINAL CLINICAL IMPRESSION(S) / ED DIAGNOSES  Final diagnoses:  Temporal mandibular joint disorder  Otalgia, right      NEW MEDICATIONS STARTED DURING THIS VISIT:  New Prescriptions   AMOXICILLIN (AMOXIL) 500 MG  CAPSULE    Take 1 capsule (500 mg total) by mouth 2 (two) times daily.     Note:  This document was prepared using Dragon voice recognition software and may include unintentional dictation errors.    Jene Every, MD 03/07/16 [phone]

## 2016-03-07 NOTE — ED Notes (Signed)
States she developed pain to right side of face /jaw area couple of days ago states she took some Ibu with relief  But pain woke her back up at 3 am  Swelling noted to right side of face pain radiates into right ear .Marland Kitchen. And is not able to open mouth completely.

## 2016-03-08 ENCOUNTER — Encounter: Payer: Self-pay | Admitting: Physician Assistant

## 2016-03-08 ENCOUNTER — Ambulatory Visit (INDEPENDENT_AMBULATORY_CARE_PROVIDER_SITE_OTHER): Payer: Commercial Managed Care - HMO | Admitting: Physician Assistant

## 2016-03-08 VITALS — BP 122/76 | HR 90 | Temp 98.6°F | Resp 16 | Ht 65.0 in | Wt 180.0 lb

## 2016-03-08 DIAGNOSIS — G4700 Insomnia, unspecified: Secondary | ICD-10-CM | POA: Diagnosis not present

## 2016-03-08 DIAGNOSIS — F329 Major depressive disorder, single episode, unspecified: Secondary | ICD-10-CM | POA: Diagnosis not present

## 2016-03-08 DIAGNOSIS — F419 Anxiety disorder, unspecified: Secondary | ICD-10-CM

## 2016-03-08 DIAGNOSIS — F32A Depression, unspecified: Secondary | ICD-10-CM

## 2016-03-08 MED ORDER — ALPRAZOLAM 0.5 MG PO TABS: ORAL_TABLET | ORAL | 2 refills | Status: DC

## 2016-03-08 MED ORDER — CITALOPRAM HYDROBROMIDE 20 MG PO TABS
ORAL_TABLET | ORAL | 2 refills | Status: AC
Start: 2016-03-08 — End: ?

## 2016-03-08 NOTE — Progress Notes (Signed)
Patient ID: Isabel Hines MRN: [id number], DOB: 09-24-1965, 50 y.o. Date of Encounter: 03/08/2016, 4:30 PM    Chief Complaint:  Chief Complaint  Patient presents with  . Medication Refill    Xanax     HPI: 50 y.o. year old white female is here for refill on her Xanax.  She says that she continues to use it mostly just 1 at bedtime. She doesn't even take it every single night at bedtime --- for example on the weekends she usually doesn't need to take it. Says that as long as she can remember she can recall her mind racing when she tries to sleep at night. Says that she occasionally needs to take 1 during the day----says that occasionally at work she will get so wound up that she has to take a Xanax in order to calm down and focus.  Says this only happens (where she needs one during the day)  about twice per month.  Her gynecologist is the one that actually started her Celexa and prescribed that. Says that that has been a "miracle drug"--- says that she feels so much better since taking that. Will go ahead and fill send refills on that as well.  Reviewed that she had complete physical with me September 2015 including screening labs. She says that she also continues to see her gynecologist annually and " they check everything" when she goes there also.     Home Meds:   Outpatient Medications Prior to Visit  Medication Sig Dispense Refill  . amoxicillin (AMOXIL) 500 MG capsule Take 1 capsule (500 mg total) by mouth 2 (two) times daily. 14 capsule 0  . cetirizine (ZYRTEC) 10 MG tablet Take 10 mg by mouth daily.    . cholecalciferol (VITAMIN D) 1000 UNITS tablet Take 1,000 Units by mouth once a week.    . Multiple Vitamin (MULTIVITAMIN) tablet Take 1 tablet by mouth daily.    Marland Kitchen. ALPRAZolam (XANAX) 0.5 MG tablet TAKE 1 TABLET BY MOUTH AS NEEDED FOR SLEEP 30 tablet 2  . citalopram (CELEXA) 20 MG tablet TK 1 T PO QD  1  . albuterol (PROVENTIL HFA;VENTOLIN HFA) 108 (90 BASE) MCG/ACT  inhaler Inhale 2 puffs into the lungs every 6 (six) hours as needed for wheezing or shortness of breath. (Patient not taking: Reported on 03/08/2016) 1 Inhaler 0  . cefdinir (OMNICEF) 300 MG capsule Take 1 capsule (300 mg total) by mouth 2 (two) times daily. (Patient not taking: Reported on 03/08/2016) 20 capsule 0  . ipratropium (ATROVENT) 0.06 % nasal spray U 2 SPRAYS IEN TID  0  . montelukast (SINGULAIR) 10 MG tablet Take 1 tablet (10 mg total) by mouth at bedtime. (Patient not taking: Reported on 03/08/2016) 30 tablet 5  . naproxen (NAPROSYN) 500 MG tablet Take 1 tablet (500 mg total) by mouth 2 (two) times daily with a meal. (Patient not taking: Reported on 03/08/2016) 20 tablet 2  . omeprazole (PRILOSEC) 20 MG capsule TAKE 1 CAPSULE BY MOUTH EVERY DAY (Patient not taking: Reported on 06/25/2015) 30 capsule 11  . predniSONE (DELTASONE) 20 MG tablet Take 3 daily for 2 days, then 2 daily for 2 days, then 1 daily for 2 days. (Patient not taking: Reported on 03/08/2016) 12 tablet 0   No facility-administered medications prior to visit.     Allergies:  Allergies  Allergen Reactions  . Hydrocodone Itching  . Sulfa Antibiotics Hives  . Tramadol Hives      Review of Systems:  See HPI for pertinent ROS. All other ROS negative.    Physical Exam: Blood pressure 122/76, pulse 90, temperature 98.6 F (37 C), temperature source Oral, resp. rate 16, height 5\' 5"  (1.651 m), weight 180 lb (81.6 kg), SpO2 99 %., Body mass index is 29.95 kg/m. General:  WNWD WF. Appears in no acute distress. Neck: Supple. No thyromegaly. No lymphadenopathy. Lungs: Clear bilaterally to auscultation without wheezes, rales, or rhonchi. Breathing is unlabored. Heart: Regular rhythm. No murmurs, rubs, or gallops. Msk:  Strength and tone normal for age. Extremities/Skin: Warm and dry. Neuro: Alert and oriented X 3. Moves all extremities spontaneously. Gait is normal. CNII-XII grossly in tact. Psych:  Responds to questions  appropriately with a normal affect.     ASSESSMENT AND PLAN:  50 y.o. year old female with  1. Anxiety Stable/controlled. Continue current dose of Celexa and Xanax.  2. Depression Stable/controlled. Continue current dose of Celexa. 3. Insomnia Stable/controlled. Continue current dose of Celexa and Xanax.   [id number], Tzion Wedel Beth BellflowerDixon, [place]PA, Op[facility] CampusBSFM 03/08/2016 4:30 PM

## 2016-08-14 IMAGING — US US PELVIS COMPLETE
1 series · 13 of 25 positions shown · non-contrast
Comparison: None.

CLINICAL DATA: Left pelvic pain for 1 day. Status post endometrial
ablation in [K7].

EXAM:
TRANSABDOMINAL AND TRANSVAGINAL ULTRASOUND OF PELVIS
DOPPLER ULTRASOUND OF OVARIES
TECHNIQUE: Both transabdominal and transvaginal ultrasound examinations of the
pelvis were performed. Transabdominal technique was performed for
global imaging of the pelvis including uterus, ovaries, adnexal
regions, and pelvic cul-de-sac.
It was necessary to proceed with endovaginal exam following the
transabdominal exam to visualize the ovaries and better visualize
the uterus. Color and duplex Doppler ultrasound was utilized to
evaluate blood flow to the ovaries.

[Series 1: us pelvis complete · 0.23mm/px · 13 of 94 slices shown]
[im 1/94]
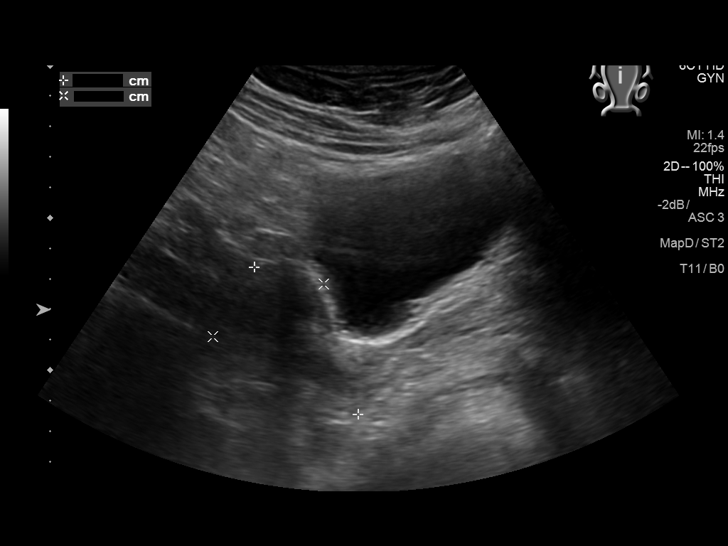
[im 8/94]
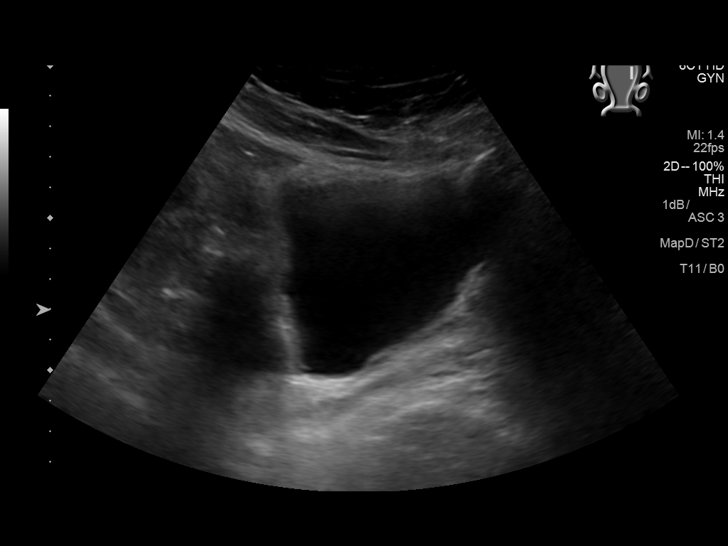
[im 16/94]
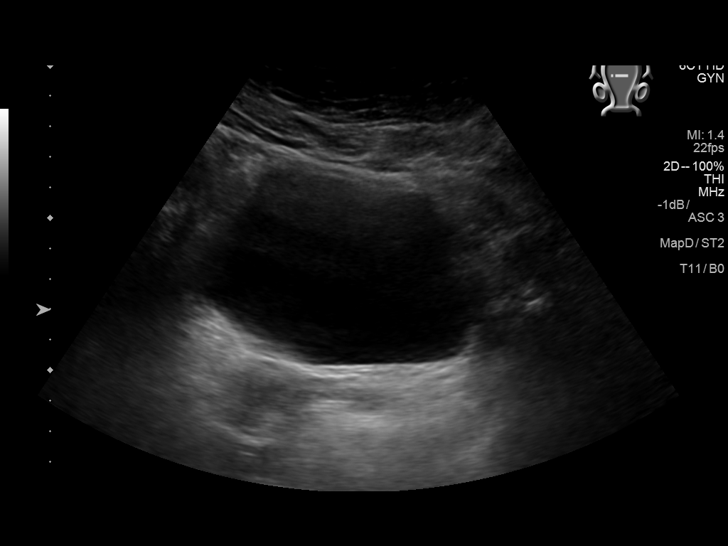
[im 24/94]
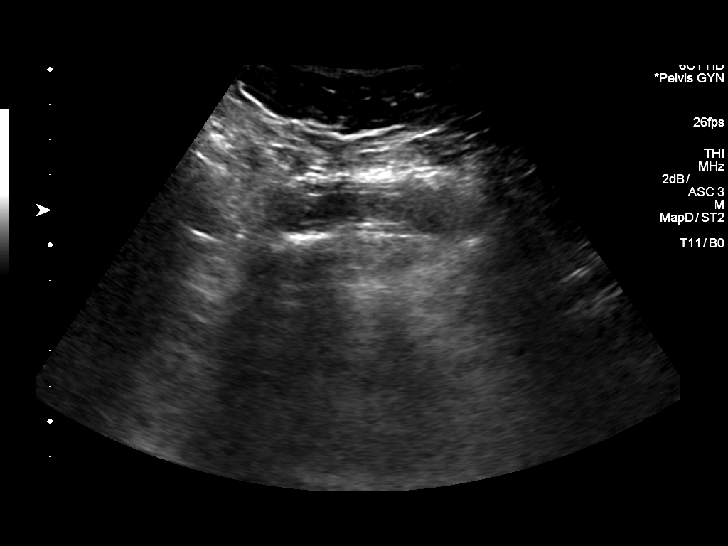
[im 32/94]
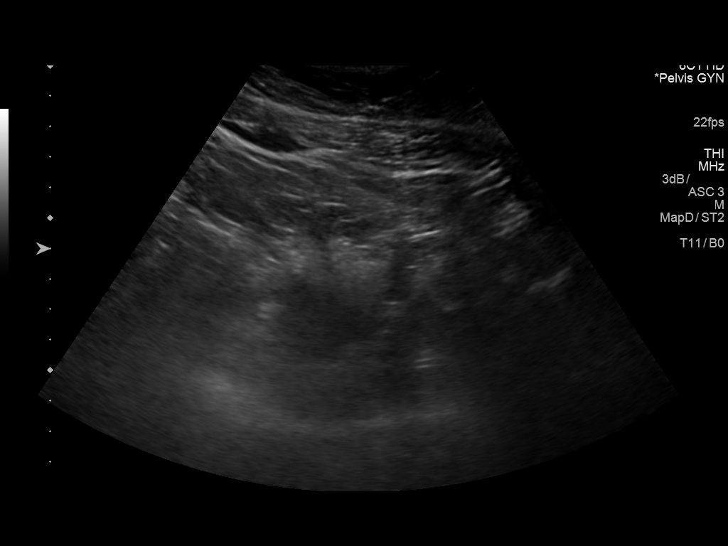
[im 39/94]
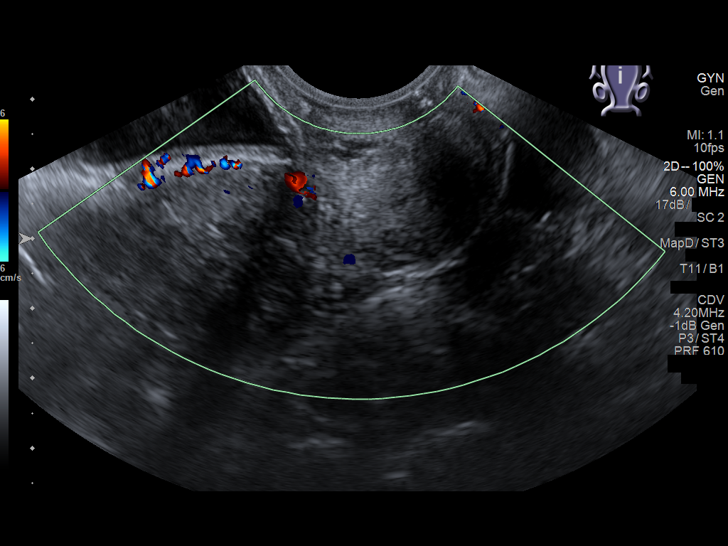
[im 47/94]
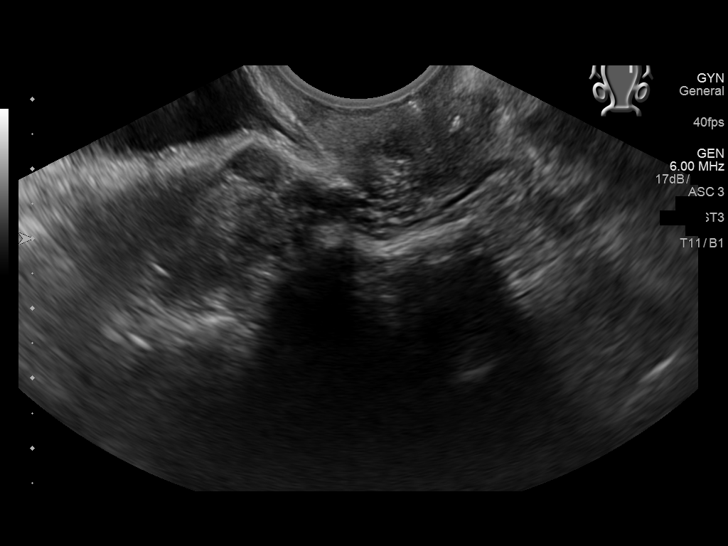
[im 55/94]
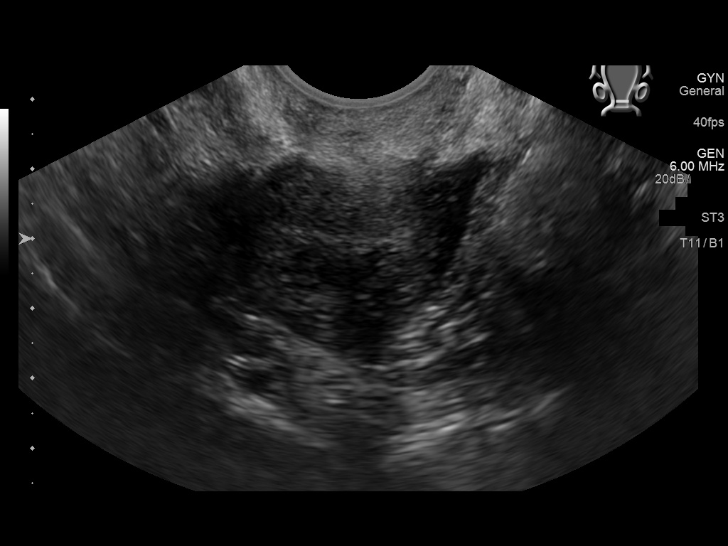
[im 63/94]
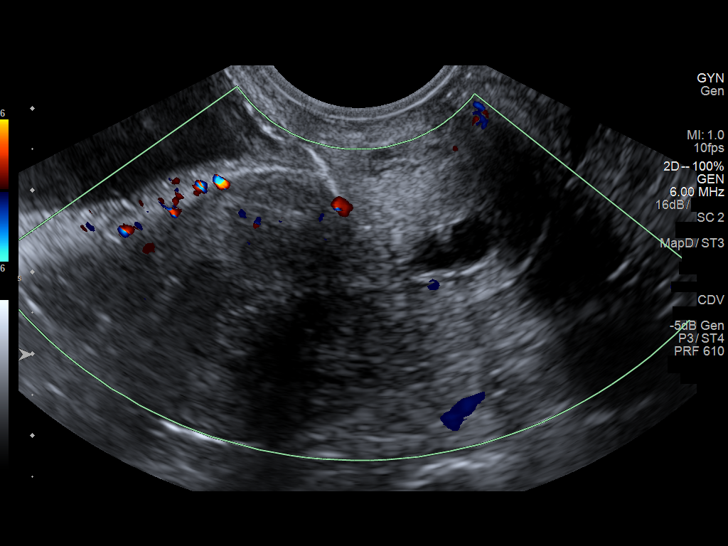
[im 70/94]
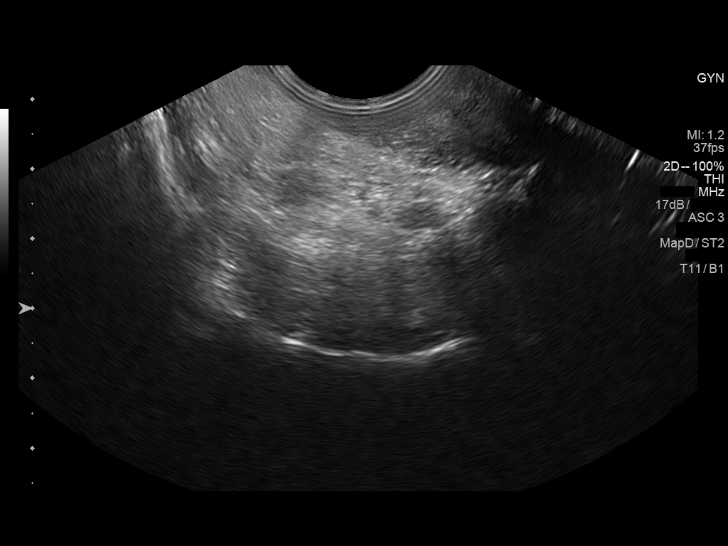
[im 78/94]
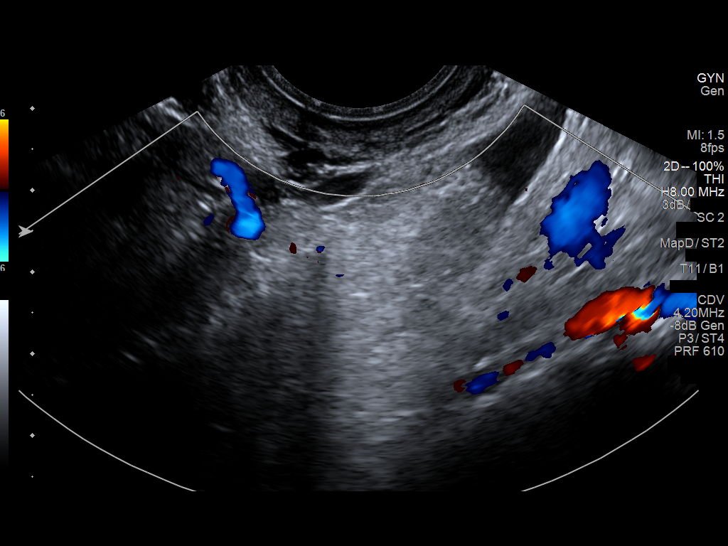
[im 86/94]
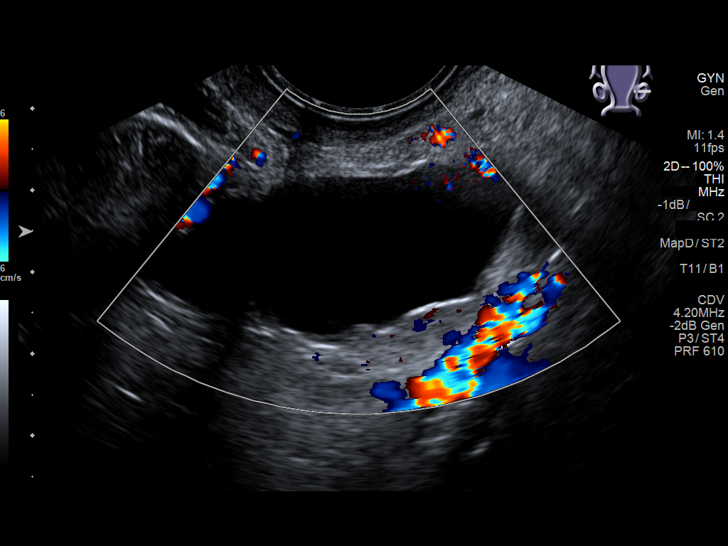
[im 94/94]
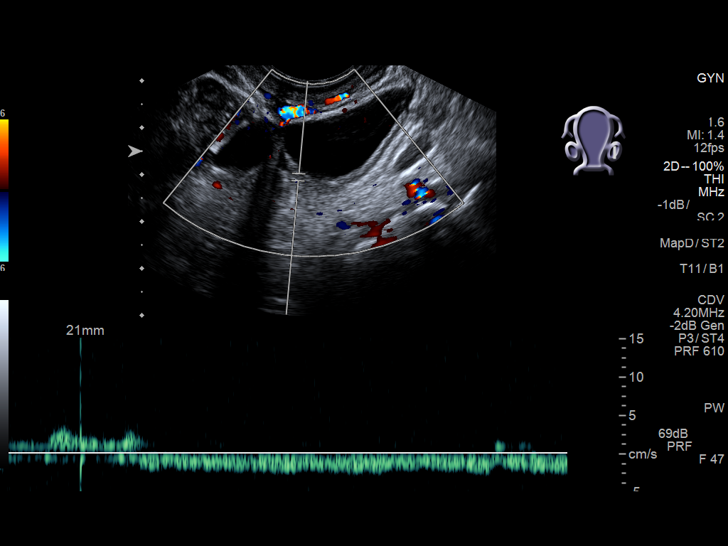

[13 of 25 positions shown; findings below may reference images not displayed]

FINDINGS: Uterus

Measurements: 6.2 x 4.1 x 3.1 cm. No fibroids or other mass
visualized.

Endometrium

Thickness: 4.9 mm.  No focal abnormality visualized.

Right ovary

Not visualized.

Left ovary

Measurements: 4.5 x 3.8 x 2.4 cm. 4.0 x 2.8 x 1.7 cm simple cyst.

Pulsed Doppler evaluation of the left ovary demonstrates normal
low-resistance arterial and venous waveforms.

Other findings

No abnormal free fluid.
IMPRESSION: 1. 4.0 cm simple appearing left ovarian cyst. This is almost
certainly benign, and no specific imaging follow up is recommended
according to the Society of Radiologists in [K7] Consensus
Conference Statement (FULERA et al. Management of Asymptomatic
Ovarian and Other Adnexal Cysts Imaged at US: Society of
Radiologists in Ultrasound Consensus Conference Statement [K7].
Radiology [DATE]): [id number].).
2. Nonvisualized right ovary.
3. Unremarkable uterus.

## 2016-11-22 ENCOUNTER — Emergency Department: Payer: 59

## 2016-11-22 ENCOUNTER — Emergency Department
Admission: EM | Admit: 2016-11-22 | Discharge: 2016-11-22 | Disposition: A | Discharge status: Home / Self Care | Payer: 59 | Attending: Emergency Medicine | Admitting: Emergency Medicine

## 2016-11-22 ENCOUNTER — Encounter: Payer: Self-pay | Admitting: Emergency Medicine

## 2016-11-22 DIAGNOSIS — F17210 Nicotine dependence, cigarettes, uncomplicated: Secondary | ICD-10-CM | POA: Insufficient documentation

## 2016-11-22 DIAGNOSIS — R1032 Left lower quadrant pain: Secondary | ICD-10-CM | POA: Diagnosis present

## 2016-11-22 DIAGNOSIS — N83202 Unspecified ovarian cyst, left side: Secondary | ICD-10-CM

## 2016-11-22 DIAGNOSIS — R102 Pelvic and perineal pain: Secondary | ICD-10-CM

## 2016-11-22 LAB — URINALYSIS, COMPLETE (UACMP) WITH MICROSCOPIC
BILIRUBIN URINE: NEGATIVE
GLUCOSE, UA: NEGATIVE mg/dL
Ketones, ur: NEGATIVE mg/dL
Leukocytes, UA: NEGATIVE
NITRITE: NEGATIVE
PH: 7 (ref 5.0–8.0)
Protein, ur: NEGATIVE mg/dL
SPECIFIC GRAVITY, URINE: 1.002 — AB (ref 1.005–1.030)

## 2016-11-22 LAB — COMPREHENSIVE METABOLIC PANEL
ALBUMIN: 4.4 g/dL (ref 3.5–5.0)
ALK PHOS: 82 U/L (ref 38–126)
ALT: 19 U/L (ref 14–54)
AST: 10 U/L — ABNORMAL LOW (ref 15–41)
Anion gap: 10 (ref 5–15)
BUN: 12 mg/dL (ref 6–20)
CHLORIDE: 106 mmol/L (ref 101–111)
CO2: 24 mmol/L (ref 22–32)
CREATININE: 0.61 mg/dL (ref 0.44–1.00)
Calcium: 9.3 mg/dL (ref 8.9–10.3)
GFR calc Af Amer: >60 mL/min (ref >60)
GFR calc non Af Amer: >60 mL/min (ref >60)
GLUCOSE: 112 mg/dL — AB (ref 65–99)
Potassium: 3.9 mmol/L (ref 3.5–5.1)
SODIUM: 140 mmol/L (ref 135–145)
Total Bilirubin: 0.7 mg/dL (ref 0.3–1.2)
Total Protein: 7 g/dL (ref 6.5–8.1)

## 2016-11-22 LAB — CBC WITH DIFFERENTIAL/PLATELET
Basophils Absolute: 0.1 10*3/uL (ref 0–0.1)
Basophils Relative: 1 %
EOS ABS: 0 10*3/uL (ref 0–0.7)
EOS PCT: 1 %
HCT: 40.5 % (ref 35.0–47.0)
Hemoglobin: 14.1 g/dL (ref 12.0–16.0)
LYMPHS ABS: 2.1 10*3/uL (ref 1.0–3.6)
LYMPHS PCT: 28 %
MCH: 29.6 pg (ref 26.0–34.0)
MCHC: 34.8 g/dL (ref 32.0–36.0)
MCV: 84.9 fL (ref 80.0–100.0)
MONOS PCT: 5 %
Monocytes Absolute: 0.4 10*3/uL (ref 0.2–0.9)
Neutro Abs: 4.9 10*3/uL (ref 1.4–6.5)
Neutrophils Relative %: 65 %
PLATELETS: 199 10*3/uL (ref 150–440)
RBC: 4.77 MIL/uL (ref 3.80–5.20)
RDW: 14.8 % — ABNORMAL HIGH (ref 11.5–14.5)
WBC: 7.5 10*3/uL (ref 3.6–11.0)

## 2016-11-22 LAB — LIPASE, BLOOD: Lipase: 24 U/L (ref 11–51)

## 2016-11-22 LAB — PREGNANCY, URINE: Preg Test, Ur: NEGATIVE

## 2016-11-22 IMAGING — CT CT ABD-PELV W/ CM
2 of 5 series · 16 of 46 positions shown, 18 images · IV contrast (APPLIED)
Comparison: Pelvic ultrasound [DATE]

CLINICAL DATA: Left lower abdominal pain starting on [REDACTED].

EXAM:
CT ABDOMEN AND PELVIS WITH CONTRAST
TECHNIQUE: Multidetector CT imaging of the abdomen and pelvis was performed
using the standard protocol following bolus administration of
intravenous contrast.
CONTRAST:  100mL [W9] IOPAMIDOL ([W9]) INJECTION 61%

[Series 2: axial st · axial · 0.82mm/px · z∈[-507,-112]mm · 13 of 89 slices shown, 15 images]
[im 5/89  soft-tissue]
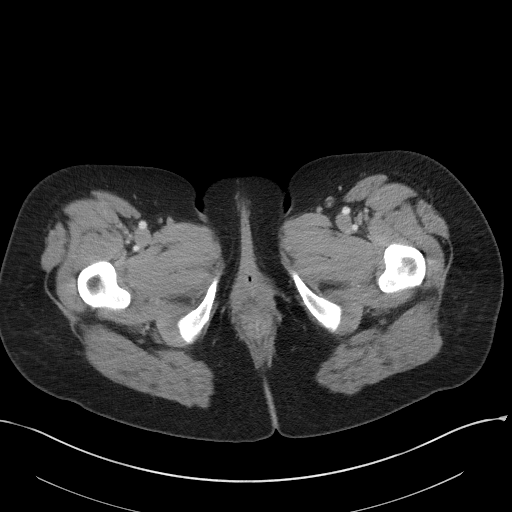
[im 5/89  bone]
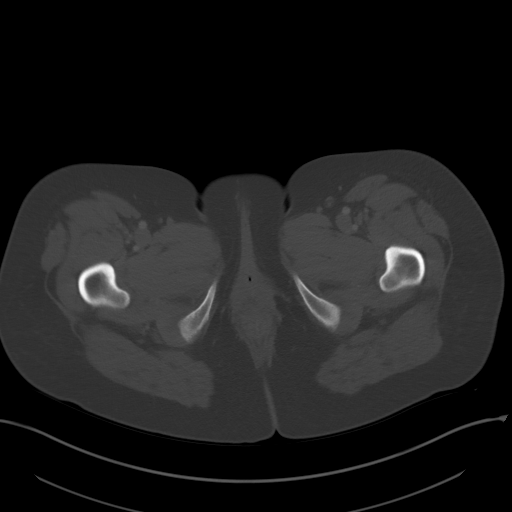
[im 10/89  soft-tissue]
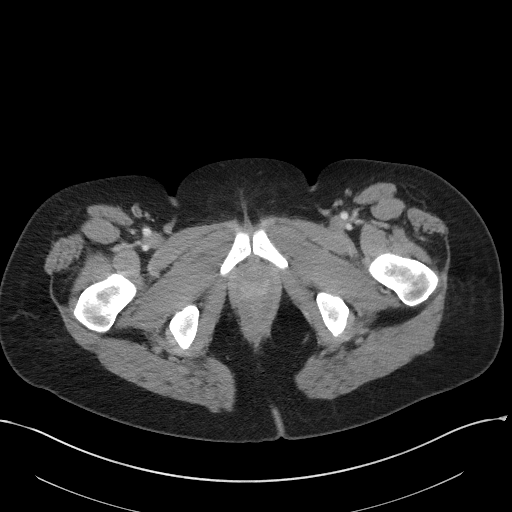
[im 20/89  soft-tissue]
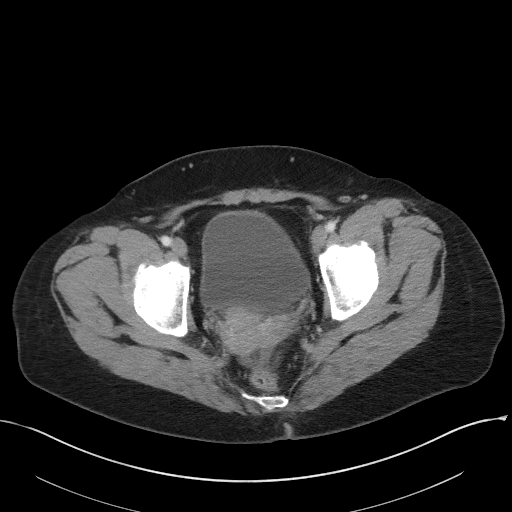
[im 25/89  soft-tissue]
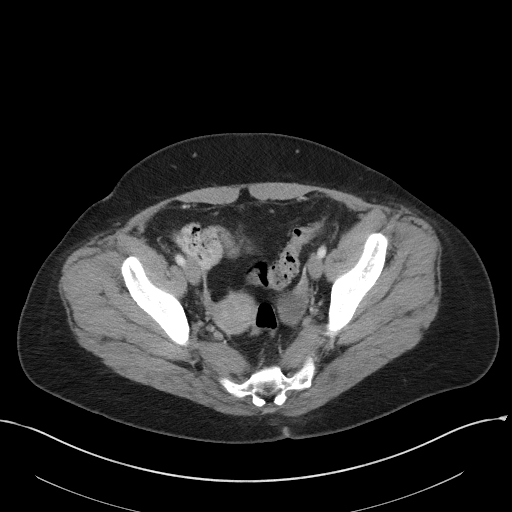
[im 30/89  soft-tissue]
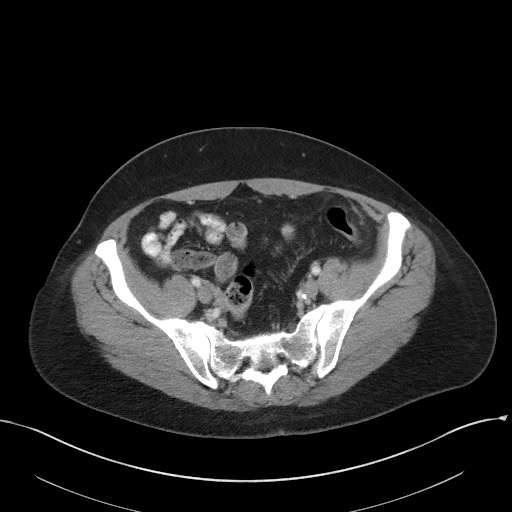
[im 40/89  soft-tissue]
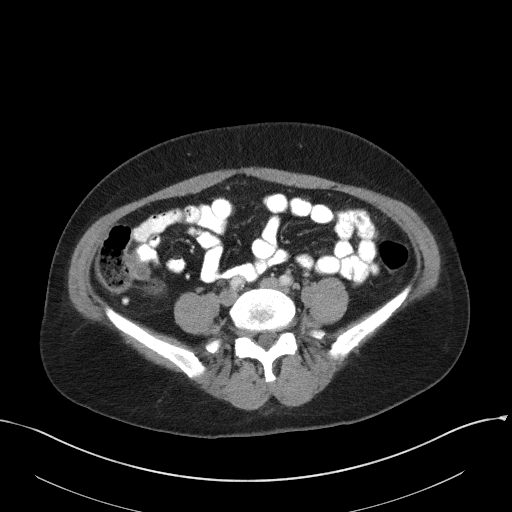
[im 45/89  soft-tissue]
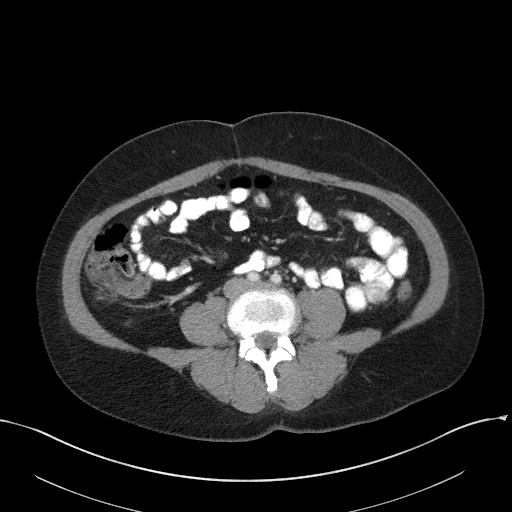
[im 49/89  soft-tissue]
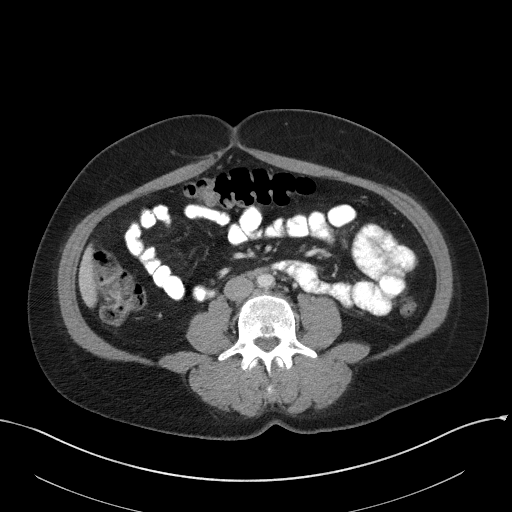
[im 59/89  soft-tissue]
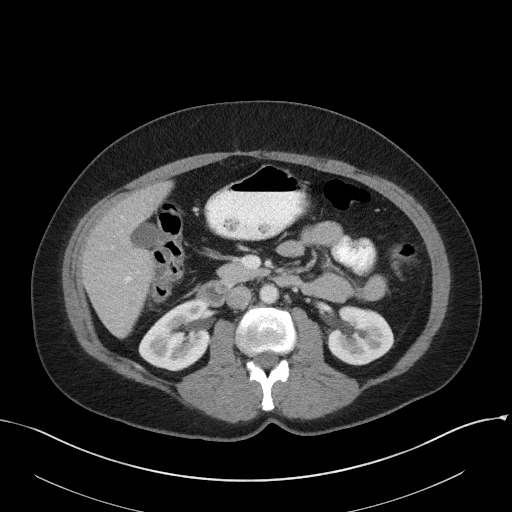
[im 59/89  bone]
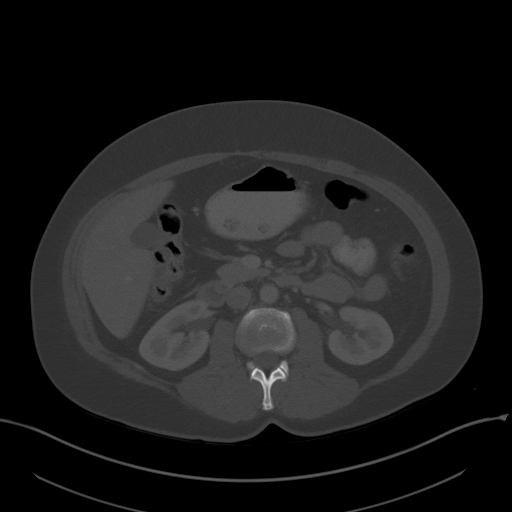
[im 64/89  soft-tissue]
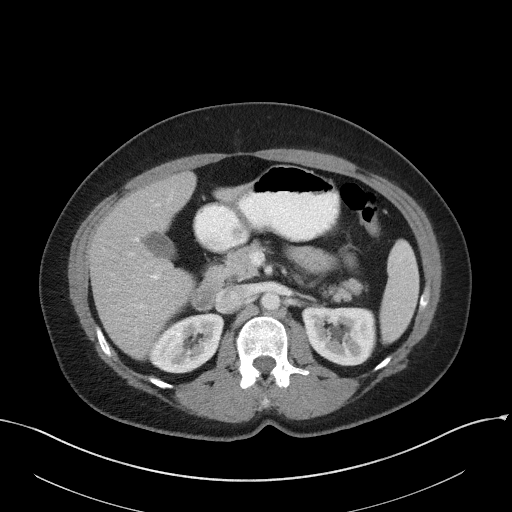
[im 69/89  soft-tissue]
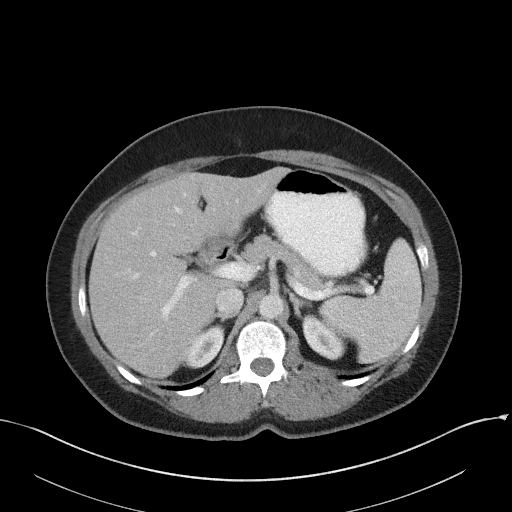
[im 79/89  soft-tissue]
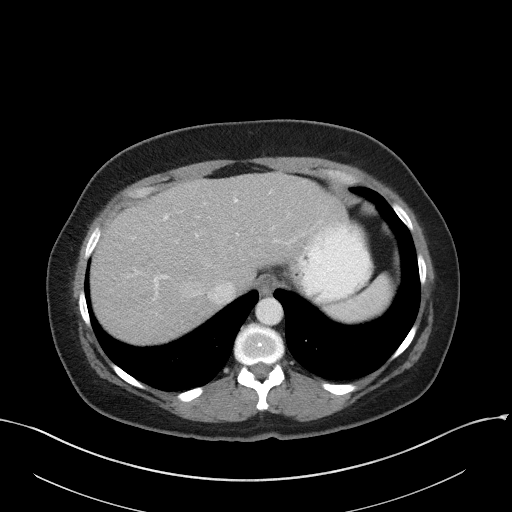
[im 84/89  soft-tissue]
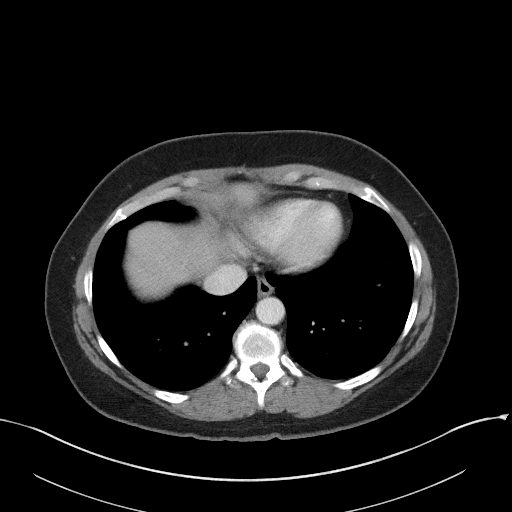

[Series 5: coronal st · coronal · 0.79mm/px · 3 of 89 slices shown]
[im 30/89  soft-tissue]
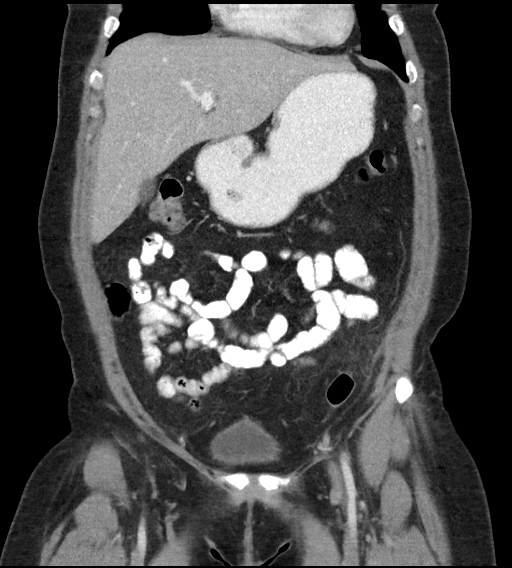
[im 40/89  soft-tissue]
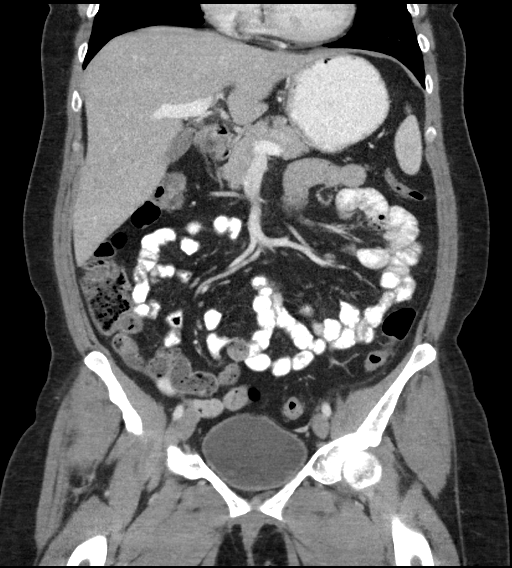
[im 49/89  soft-tissue]
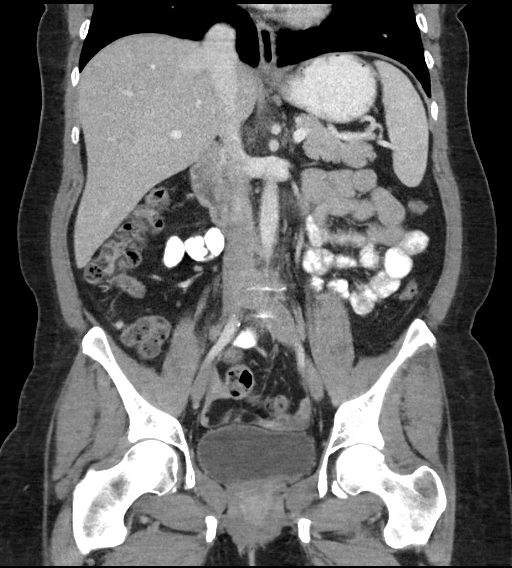

[16 of 46 positions shown; findings below may reference images not displayed]

FINDINGS: Lower chest: Unremarkable

Hepatobiliary: Dependent density in the gallbladder, probably
sludge, less likely to be gallstones. No biliary dilatation. The
liver appears otherwise unremarkable.

Pancreas: Unremarkable

Spleen: Unremarkable

Adrenals/Urinary Tract: Unremarkable

Stomach/Bowel: Along the anterior margin of the junction of the
descending and sigmoid colon there is abnormal stranding in the
omental adipose tissue, for example images 55-59 of series 2. I do
not see a definite underlying inflamed diverticulum of the colon.

Vascular/Lymphatic: Mild aortoiliac atherosclerotic calcification.

Reproductive: 3.6 by 2.8 cm cystic lesion in the left adnexa could
be an ovarian cyst or hydrosalpinx based on its appearance.
Otherwise unremarkable

Other: No supplemental non-categorized findings.

Musculoskeletal: Unremarkable
IMPRESSION: 1. Abnormal inflammatory focus in the omental adipose tissue of the
left lower quadrant, at the junction of the descending and sigmoid
colon. Differential diagnostic considerations include subacute
omental infarct and appendagitis epiploica. I do not observe a
definite diverticulum in this vicinity and accordingly focal
diverticulitis seems less likely.
2. Cystic lesion in the left adnexa could reflect hydrosalpinx or
ovarian cyst. No surrounding inflammatory findings observed along
the adnexa.
3. Dependent density in the gallbladder is probably sludge, less
likely small gallstones.
4.  Aortic Atherosclerosis ([W9]-[W9]).

## 2016-11-22 MED ORDER — FENTANYL CITRATE (PF) 100 MCG/2ML IJ SOLN
50.0000 ug | Freq: Once | INTRAMUSCULAR | Status: AC
Start: 2016-11-22 — End: 2016-11-22
  Administered 2016-11-22: 50 ug via INTRAVENOUS
  Filled 2016-11-22: qty 2

## 2016-11-22 MED ORDER — IOPAMIDOL (ISOVUE-300) INJECTION 61%
30.0000 mL | Freq: Once | INTRAVENOUS | Status: AC | PRN
  Administered 2016-11-22: 30 mL via ORAL

## 2016-11-22 MED ORDER — ONDANSETRON HCL 4 MG/2ML IJ SOLN
4.0000 mg | Freq: Once | INTRAMUSCULAR | Status: AC
  Administered 2016-11-22: 4 mg via INTRAVENOUS
  Filled 2016-11-22: qty 2

## 2016-11-22 MED ORDER — IOPAMIDOL (ISOVUE-300) INJECTION 61%
100.0000 mL | Freq: Once | INTRAVENOUS | Status: AC | PRN
  Administered 2016-11-22: 100 mL via INTRAVENOUS

## 2016-11-22 MED ORDER — SODIUM CHLORIDE 0.9 % IV BOLUS (SEPSIS)
1000.0000 mL | Freq: Once | INTRAVENOUS | Status: AC
  Administered 2016-11-22: 1000 mL via INTRAVENOUS

## 2016-11-22 MED ORDER — FENTANYL CITRATE (PF) 100 MCG/2ML IJ SOLN
50.0000 ug | Freq: Once | INTRAMUSCULAR | Status: AC
  Administered 2016-11-22: 50 ug via INTRAVENOUS
  Filled 2016-11-22: qty 2

## 2016-11-22 MED ORDER — IBUPROFEN 800 MG PO TABS: 800.0000 mg | ORAL_TABLET | Freq: Three times a day (TID) | ORAL | 0 refills | Status: DC | PRN

## 2016-11-22 NOTE — ED Notes (Signed)
Patient transported to CT 

## 2016-11-22 NOTE — ED Triage Notes (Signed)
Saturday, pt starting having left lower abd pain, felt it was her left ovary as that has given her issues in the past, as weekend progressed, pain increased, states now she feels like something is stabbing her. Feels increased pressure when she has to poop or pee. Denies V,D. Pain does not radiate. Has had kidney stones in the past and states it does not feel like that.

## 2016-11-22 NOTE — ED Notes (Signed)
Lab notified to add on Urine Pregnancy 

## 2016-11-22 NOTE — ED Notes (Signed)
Patient transported to Ultrasound 

## 2016-11-22 NOTE — ED Provider Notes (Signed)
[facility] Emergency Department Provider Note  ____________________________________________   First MD Initiated Contact with Patient 11/22/16 1145     (approximate)  I have reviewed the triage vital signs and the nursing notes.   HISTORY  Chief Complaint Back Pain and Abdominal Pain   HPI Isabel Hines is a 51 y.o. female who is postmenopausal who is presenting to the emergency department with left lower quadrant abdominal pain over the past 2 days. Says the pain is severe, especially with movement. Denies any nausea vomiting or diarrhea. Says she had normal bowel movement yesterday. York Spaniel it feels something is moving in her left lower quadrant and feels that it may be related to her ovary. She says that in the past she had very heavy periods but had her last period 2011. She denies any history of ovarian cysts or uterine pathology besides dysmenorrhea. Denies any unintentional weight loss. Denies any burning with urination or blood in her urine. Does not report any vaginal discharge.   Past Medical History:  Diagnosis Date  . Anxiety   . GERD (gastroesophageal reflux disease)     Patient Active Problem List   Diagnosis Date Noted  . Insomnia 06/25/2015  . Depression 04/02/2015  . Abnormal cervical Pap smear with positive HPV DNA test 06/10/2014  . Anxiety   . GERD (gastroesophageal reflux disease)     Past Surgical History:  Procedure Laterality Date  . uterine ablation      Prior to Admission medications   Medication Sig Start Date End Date Taking? Authorizing Provider  ALPRAZolam Prudy Feeler) 0.5 MG tablet TAKE 1 TABLET BY MOUTH AS NEEDED FOR SLEEP 03/08/16  Yes Dorena Bodo, PA-C  atorvastatin (LIPITOR) 10 MG tablet Take 10 mg by mouth daily. 11/04/16  Yes Historical Provider, MD  cetirizine (ZYRTEC) 10 MG tablet Take 10 mg by mouth daily.   Yes Historical Provider, MD  citalopram (CELEXA) 20 MG tablet Take 1 daily. 03/08/16  Yes Mary B Critcher,  PA-C  Flaxseed, Linseed, (FLAX SEEDS PO) Take 1 tablet by mouth daily.   Yes Historical Provider, MD  fluticasone (FLONASE) 50 MCG/ACT nasal spray Place into both nostrils daily.   Yes Historical Provider, MD  Multiple Vitamin (MULTIVITAMIN) tablet Take 1 tablet by mouth daily.   Yes Historical Provider, MD    Allergies Hydrocodone; Sulfa antibiotics; and Tramadol  Family History  Problem Relation Age of Onset  . Cancer Mother   . Hyperlipidemia Father     Social History Social History  Substance Use Topics  . Smoking status: Current Every Day Smoker    Packs/day: 1.00    Types: Cigarettes  . Smokeless tobacco: Never Used  . Alcohol use No    Review of Systems  Constitutional: No fever/chills Eyes: No visual changes. ENT: No sore throat. Cardiovascular: Denies chest pain. Respiratory: Denies shortness of breath. Gastrointestinal: No nausea, no vomiting.  No diarrhea.  No constipation. Genitourinary: Negative for dysuria. Musculoskeletal: Negative for back pain. Skin: Negative for rash. Neurological: Negative for headaches, focal weakness or numbness.   ____________________________________________   PHYSICAL EXAM:  VITAL SIGNS: ED Triage Vitals  Enc Vitals Group     BP 11/22/16 1014 122/71     Pulse Rate 11/22/16 1014 84     Resp 11/22/16 1014 20     Temp 11/22/16 1014 98.5 F (36.9 C)     Temp Source 11/22/16 1014 Oral     SpO2 11/22/16 1014 98 %     Weight  11/22/16 1009 170 lb (77.1 kg)     Height 11/22/16 1009  (1.651 m)     Head Circumference --      Peak Flow --      Pain Score 11/22/16 1009 6     Pain Loc --      Pain Edu? --      Excl. in GC? --     Constitutional: Alert and oriented. Patient sitting upright on the side of the bed and says that it hurts in the left lower quadrant or abdomen when she lays flat. Eyes: Conjunctivae are normal. PERRL. EOMI. Head: Atraumatic. Nose: No congestion/rhinnorhea. Mouth/Throat: Mucous membranes are  moist.   Neck: No stridor.   Cardiovascular: Normal rate, regular rhythm. Grossly normal heart sounds.  Good peripheral circulation. Respiratory: Normal respiratory effort.  No retractions. Lungs CTAB. Gastrointestinal: Soft and with tenderness to the left flank and left lower quadrant which is moderate. No masses. No hernias visualized or palpated. No distention. No CVA tenderness. Musculoskeletal: No lower extremity tenderness nor edema.  No joint effusions. Neurologic:  Normal speech and language. No gross focal neurologic deficits are appreciated.  Skin:  Skin is warm, dry and intact. No rash noted. Psychiatric: Mood and affect are normal. Speech and behavior are normal.  ____________________________________________   LABS (all labs ordered are listed, but only abnormal results are displayed)  Labs Reviewed  URINALYSIS, COMPLETE (UACMP) WITH MICROSCOPIC - Abnormal; Notable for the following:       Result Value   Color, Urine COLORLESS (*)    APPearance CLEAR (*)    Specific Gravity, Urine 1.002 (*)    Hgb urine dipstick SMALL (*)    Bacteria, UA RARE (*)    Squamous Epithelial / LPF 0-5 (*)    All other components within normal limits  CBC WITH DIFFERENTIAL/PLATELET - Abnormal; Notable for the following:    RDW 14.8 (*)    All other components within normal limits  COMPREHENSIVE METABOLIC PANEL - Abnormal; Notable for the following:    Glucose, Bld 112 (*)    AST 10 (*)    All other components within normal limits  LIPASE, BLOOD  PREGNANCY, URINE   ____________________________________________  EKG   ____________________________________________  RADIOLOGY  CT Abdomen Pelvis W Contrast (Final result)  Result time 11/22/16 14:46:21  Final result by Herbie Baltimore, MD (11/22/16 14:46:21)           Narrative:   CLINICAL DATA: Left lower abdominal pain starting on Saturday.  EXAM: CT ABDOMEN AND PELVIS WITH CONTRAST  TECHNIQUE: Multidetector CT imaging of the  abdomen and pelvis was performed using the standard protocol following bolus administration of intravenous contrast.  CONTRAST: ISOVUE-300 IOPAMIDOL (ISOVUE-300) INJECTION 61%  COMPARISON: Pelvic ultrasound 11/22/2016  FINDINGS: Lower chest: Unremarkable  Hepatobiliary: Dependent density in the gallbladder, probably sludge, less likely to be gallstones. No biliary dilatation. The liver appears otherwise unremarkable.  Pancreas: Unremarkable  Spleen: Unremarkable  Adrenals/Urinary Tract: Unremarkable  Stomach/Bowel: Along the anterior margin of the junction of the descending and sigmoid colon there is abnormal stranding in the omental adipose tissue, for example images 55-59 of series 2. I do not see a definite underlying inflamed diverticulum of the colon.  Vascular/Lymphatic: Mild aortoiliac atherosclerotic calcification.  Reproductive: 3.6 by 2.8 cm cystic lesion in the left adnexa could be an ovarian cyst or hydrosalpinx based on its appearance. Otherwise unremarkable  Other: No supplemental non-categorized findings.  Musculoskeletal: Unremarkable  IMPRESSION: 1. Abnormal inflammatory focus in  the omental adipose tissue of the left lower quadrant, at the junction of the descending and sigmoid colon. Differential diagnostic considerations include subacute omental infarct and appendagitis epiploica. I do not observe a definite diverticulum in this vicinity and accordingly focal diverticulitis seems less likely. 2. Cystic lesion in the left adnexa could reflect hydrosalpinx or ovarian cyst. No surrounding inflammatory findings observed along the adnexa. 3. Dependent density in the gallbladder is probably sludge, less likely small gallstones. 4. Aortic Atherosclerosis (ICD10-I70.0).   Electronically Signed By: Gaylyn Rong M.D. On: 11/22/2016 14:46            [place] Art/Ven Flow Abd Pelv Doppler (Final result)  Result time 11/22/16 14:36:14    Final result by Gordan Payment, MD (11/22/16 14:36:14)           Narrative:   CLINICAL DATA: Left pelvic pain for 1 day. Status post endometrial ablation in 2011.  EXAM: TRANSABDOMINAL AND TRANSVAGINAL ULTRASOUND OF PELVIS  DOPPLER ULTRASOUND OF OVARIES  TECHNIQUE: Both transabdominal and transvaginal ultrasound examinations of the pelvis were performed. Transabdominal technique was performed for global imaging of the pelvis including uterus, ovaries, adnexal regions, and pelvic cul-de-sac.  It was necessary to proceed with endovaginal exam following the transabdominal exam to visualize the ovaries and better visualize the uterus. Color and duplex Doppler ultrasound was utilized to evaluate blood flow to the ovaries.  COMPARISON: None.  FINDINGS: Uterus  Measurements: 6.2 x 4.1 x 3.1 cm. No fibroids or other mass visualized.  Endometrium  Thickness: 4.9 mm. No focal abnormality visualized.  Right ovary  Not visualized.  Left ovary  Measurements: 4.5 x 3.8 x 2.4 cm. 4.0 x 2.8 x 1.7 cm simple cyst.  Pulsed Doppler evaluation of the left ovary demonstrates normal low-resistance arterial and venous waveforms.  Other findings  No abnormal free fluid.  IMPRESSION: 1. 4.0 cm simple appearing left ovarian cyst. This is almost certainly benign, and no specific imaging follow up is recommended according to the Society of Radiologists in Ultrasound2010 Consensus Conference Statement (D Lenis Noon et al. Management of Asymptomatic Ovarian and Other Adnexal Cysts Imaged at [place]: Society of Radiologists in Ultrasound Consensus Conference Statement 2010. Radiology 256 (Sept 2010): 943-954.). 2. Nonvisualized right ovary. 3. Unremarkable uterus.   Electronically Signed By: Beckie Salts M.D. On: 11/22/2016 14:36            US Pelvis Complete (Final result)  Result time 11/22/16 14:36:14  Final result by Gordan Payment, MD (11/22/16 14:36:14)            Narrative:   CLINICAL DATA: Left pelvic pain for 1 day. Status post endometrial ablation in 2011.  EXAM: TRANSABDOMINAL AND TRANSVAGINAL ULTRASOUND OF PELVIS  DOPPLER ULTRASOUND OF OVARIES  TECHNIQUE: Both transabdominal and transvaginal ultrasound examinations of the pelvis were performed. Transabdominal technique was performed for global imaging of the pelvis including uterus, ovaries, adnexal regions, and pelvic cul-de-sac.  It was necessary to proceed with endovaginal exam following the transabdominal exam to visualize the ovaries and better visualize the uterus. Color and duplex Doppler ultrasound was utilized to evaluate blood flow to the ovaries.  COMPARISON: None.  FINDINGS: Uterus  Measurements: 6.2 x 4.1 x 3.1 cm. No fibroids or other mass visualized.  Endometrium  Thickness: 4.9 mm. No focal abnormality visualized.  Right ovary  Not visualized.  Left ovary  Measurements: 4.5 x 3.8 x 2.4 cm. 4.0 x 2.8 x 1.7 cm simple cyst.  Pulsed Doppler evaluation of the left ovary demonstrates normal low-resistance arterial  and venous waveforms.  Other findings  No abnormal free fluid.  IMPRESSION: 1. 4.0 cm simple appearing left ovarian cyst. This is almost certainly benign, and no specific imaging follow up is recommended according to the Society of Radiologists in Ultrasound2010 Consensus Conference Statement (D Lenis Noon et al. Management of Asymptomatic Ovarian and Other Adnexal Cysts Imaged at [place]: Society of Radiologists in Ultrasound Consensus Conference Statement 2010. Radiology 256 (Sept 2010): 943-954.). 2. Nonvisualized right ovary. 3. Unremarkable uterus.   Electronically Signed By: Beckie Salts M.D. On: 11/22/2016 14:36            US Transvaginal Non-OB (Final result)  Result time 11/22/16 14:36:14  Final result by Gordan Payment, MD (11/22/16 14:36:14)           Narrative:   CLINICAL DATA: Left pelvic pain for 1 day. Status  post endometrial ablation in 2011.  EXAM: TRANSABDOMINAL AND TRANSVAGINAL ULTRASOUND OF PELVIS  DOPPLER ULTRASOUND OF OVARIES  TECHNIQUE: Both transabdominal and transvaginal ultrasound examinations of the pelvis were performed. Transabdominal technique was performed for global imaging of the pelvis including uterus, ovaries, adnexal regions, and pelvic cul-de-sac.  It was necessary to proceed with endovaginal exam following the transabdominal exam to visualize the ovaries and better visualize the uterus. Color and duplex Doppler ultrasound was utilized to evaluate blood flow to the ovaries.  COMPARISON: None.  FINDINGS: Uterus  Measurements: 6.2 x 4.1 x 3.1 cm. No fibroids or other mass visualized.  Endometrium  Thickness: 4.9 mm. No focal abnormality visualized.  Right ovary  Not visualized.  Left ovary  Measurements: 4.5 x 3.8 x 2.4 cm. 4.0 x 2.8 x 1.7 cm simple cyst.  Pulsed Doppler evaluation of the left ovary demonstrates normal low-resistance arterial and venous waveforms.  Other findings  No abnormal free fluid.  IMPRESSION: 1. 4.0 cm simple appearing left ovarian cyst. This is almost certainly benign, and no specific imaging follow up is recommended according to the Society of Radiologists in Ultrasound2010 Consensus Conference Statement (D Lenis Noon et al. Management of Asymptomatic Ovarian and Other Adnexal Cysts Imaged at [place]: Society of Radiologists in Ultrasound Consensus Conference Statement 2010. Radiology 256 (Sept 2010): 943-954.). 2. Nonvisualized right ovary. 3. Unremarkable uterus.   Electronically Signed By: Beckie Salts M.D. On: 11/22/2016 14:36          ____________________________________________   PROCEDURES  Procedure(s) performed:   Procedures  Critical Care performed:   ____________________________________________   INITIAL IMPRESSION / ASSESSMENT AND PLAN / ED COURSE  Pertinent labs & imaging results  that were available during my care of the patient were reviewed by me and considered in my medical decision making (see chart for details).  ----------------------------------------- 3:21 PM on 11/22/2016 -----------------------------------------  Discussed the imaging findings with Dr. Earlene Plater of surgery who will be evaluating the patient in the emergency department. Dr. Derrill Kay to follow.      ____________________________________________   FINAL CLINICAL IMPRESSION(S) / ED DIAGNOSES  Final diagnoses:  Pelvic pain  Pelvic pain  Ovarian cyst Abdominal pain    NEW MEDICATIONS STARTED DURING THIS VISIT:  New Prescriptions   No medications on file     Note:  This document was prepared using Dragon voice recognition software and may include unintentional dictation errors.    Myrna Blazer, MD 11/22/16 [phone]

## 2016-11-22 NOTE — ED Provider Notes (Signed)
-----------------------------------------   4:12 PM on 11/22/2016 -----------------------------------------  Patient seen by Dr. Earlene Plater with surgery. At this point felt that patient was safe to go home. Discussed return precautions.    Phineas Semen, MD 11/22/16 1620

## 2016-11-22 NOTE — Discharge Instructions (Addendum)
Please seek medical attention for any high fevers, chest pain, shortness of breath, change in behavior, persistent vomiting, bloody stool or any other new or concerning symptoms.  

## 2016-11-22 NOTE — Consult Note (Signed)
SURGICAL CONSULTATION NOTE (initial) - cpt: [id number]  HISTORY OF PRESENT ILLNESS (HPI):  51 y.o. female presented to [facility] ED with severe LLQ abdominal pain x2 days, not associated with any N/V, change in bowel function (not diarrhea or constipation), fever/chills, CP, or SOB. Patient denies any prior similar episodes of LLQ abdominal pain.  Surgery is consulted by ED physician Dr. Pershing Proud in this context for evaluation and management of epiploic appendagitis.  PAST MEDICAL HISTORY (PMH):  Past Medical History:  Diagnosis Date  . Anxiety   . GERD (gastroesophageal reflux disease)      PAST SURGICAL HISTORY (PSH):  Past Surgical History:  Procedure Laterality Date  . uterine ablation       MEDICATIONS:  Prior to Admission medications   Medication Sig Start Date End Date Taking? Authorizing Provider  ALPRAZolam Prudy Feeler) 0.5 MG tablet TAKE 1 TABLET BY MOUTH AS NEEDED FOR SLEEP 03/08/16  Yes Dorena Bodo, PA-C  atorvastatin (LIPITOR) 10 MG tablet Take 10 mg by mouth daily. 11/04/16  Yes Historical Provider, MD  cetirizine (ZYRTEC) 10 MG tablet Take 10 mg by mouth daily.   Yes Historical Provider, MD  citalopram (CELEXA) 20 MG tablet Take 1 daily. 03/08/16  Yes Mary B Gurski, PA-C  Flaxseed, Linseed, (FLAX SEEDS PO) Take 1 tablet by mouth daily.   Yes Historical Provider, MD  fluticasone (FLONASE) 50 MCG/ACT nasal spray Place into both nostrils daily.   Yes Historical Provider, MD  Multiple Vitamin (MULTIVITAMIN) tablet Take 1 tablet by mouth daily.   Yes Historical Provider, MD     ALLERGIES:  Allergies  Allergen Reactions  . Hydrocodone Itching  . Sulfa Antibiotics Hives  . Tramadol Hives     SOCIAL HISTORY:  Social History   Social History  . Marital status: Single    Spouse name: N/A  . Number of children: N/A  . Years of education: N/A   Occupational History  . Not on file.   Social History Main Topics  . Smoking status: Current Every Day Smoker    Packs/day: 1.00   Types: Cigarettes  . Smokeless tobacco: Never Used  . Alcohol use No  . Drug use: No  . Sexual activity: Not on file   Other Topics Concern  . Not on file   Social History Narrative  . No narrative on file    The patient currently resides (home / rehab facility / nursing home): Home  The patient normally is (ambulatory / bedbound): Ambulatory  FAMILY HISTORY:  Family History  Problem Relation Age of Onset  . Cancer Mother   . Hyperlipidemia Father    REVIEW OF SYSTEMS:  Constitutional: denies weight loss, fever, chills, or sweats  Eyes: denies any other vision changes, history of eye injury  ENT: denies sore throat, hearing problems  Respiratory: denies shortness of breath, wheezing  Cardiovascular: denies chest pain, palpitations  Gastrointestinal: abdominal pain, N/V, and bowel function as per HPI Genitourinary: denies burning with urination or urinary frequency Musculoskeletal: denies any other joint pains or cramps  Skin: denies any other rashes or skin discolorations  Neurological: denies any other headache, dizziness, weakness  Psychiatric: denies any other depression, anxiety   All other review of systems were negative   VITAL SIGNS:  Temp:  [98.5 F (36.9 C)] 98.5 F (36.9 C) (04/30 1014) Pulse Rate:  [70-84] 78 (04/30 1455) Resp:  [20] 20 (04/30 1014) BP: (112-126)/(66-84) 114/66 (04/30 1455) SpO2:  [98 %-100 %] 98 % (04/30 1455) Weight:  [170  lb (77.1 kg)] 170 lb (77.1 kg) (04/30 1009)     Height:  (165.1 cm) Weight: 170 lb (77.1 kg) BMI (Calculated): 28.3   INTAKE/OUTPUT:  This shift: Total I/O In: 1000 [IV Piggyback:1000] Out: -   Last 2 shifts: @   PHYSICAL EXAM:  Constitutional:  -- Normal body habitus  -- Awake, alert, and oriented x3  Eyes:  -- Pupils equally round and reactive to light  -- No scleral icterus  Ear, nose, and throat:  -- No jugular venous distension  Pulmonary:  -- No crackles  -- Equal breath sounds  bilaterally -- Breathing non-labored at rest Cardiovascular:  -- S1, S2 present  -- No pericardial rubs Gastrointestinal:  -- Abdomen soft, with moderate LLQ abdominal tenderness to palpation, nondistended, no guarding/rebound  -- No abdominal masses appreciated, pulsatile or otherwise  Musculoskeletal and Integumentary:  -- Wounds or skin discoloration: None appreciated -- Extremities: B/L UE and LE FROM, hands and feet warm, no edema  Neurologic:  -- Motor function: intact and symmetric -- Sensation: intact and symmetric  Labs:  CBC:  Lab Results  Component Value Date   WBC 7.5 11/22/2016   RBC 4.77 11/22/2016   BMP:  Lab Results  Component Value Date   GLUCOSE 112 (H) 11/22/2016   GLUCOSE 113 (H) 04/23/2014   CO2 24 11/22/2016   CO2 23 04/23/2014   BUN 12 11/22/2016   BUN 10 04/23/2014   CREATININE 0.61 11/22/2016   CREATININE 0.92 04/23/2014   CREATININE 0.73 04/03/2014   CALCIUM 9.3 11/22/2016   CALCIUM 8.3 (L) 04/23/2014     Imaging studies:  CT Abdomen and Pelvis with Contrast (11/22/2016) 1. Abnormal inflammatory focus in the omental adipose tissue of the left lower quadrant, at the junction of the descending and sigmoid colon. Differential diagnostic considerations include subacute omental infarct and appendagitis epiploica. I do not observe a definite diverticulum in this vicinity and accordingly focal diverticulitis seems less likely. 2. Cystic lesion in the left adnexa could reflect hydrosalpinx or ovarian cyst. No surrounding inflammatory findings observed along the adnexa. 3. Dependent density in the gallbladder is probably sludge, less likely small gallstones. 4.  Aortic Atherosclerosis  Assessment/Plan: (ICD-10's: K64.89) 51 y.o. female with Left lower quadrant epiploic appendagitis, complicated by pertinent comorbidities including GERD, chronic tobacco abuse, generalized anxiety disorder, and major depression disorder.   - NSAID's for pain  -  usually self-limited, no indication for antibiotics or surgical intervention  - smoking cessation and routine screening colonoscopy at age 5 advised, discussed, and encouraged  All of the above findings and recommendations were discussed with the patient, her husband, and ED physician, and all of patient's and her husband's questions were answered to their expressed satisfaction.  Thank you for the opportunity to participate in this patient's care.   -- Scherrie Gerlach Earlene Plater, MD, RPVI [place]: [facility] - Partnering for exceptional care. Office: [phone]

## 2016-12-08 DIAGNOSIS — K6389 Other specified diseases of intestine: Secondary | ICD-10-CM | POA: Insufficient documentation

## 2016-12-08 DIAGNOSIS — R1032 Left lower quadrant pain: Secondary | ICD-10-CM | POA: Insufficient documentation

## 2017-05-26 ENCOUNTER — Other Ambulatory Visit: Payer: Self-pay | Admitting: Physician Assistant

## 2017-11-19 ENCOUNTER — Other Ambulatory Visit: Payer: Self-pay

## 2017-11-19 ENCOUNTER — Encounter: Payer: Self-pay | Admitting: Emergency Medicine

## 2017-11-19 ENCOUNTER — Emergency Department
Admission: EM | Admit: 2017-11-19 | Discharge: 2017-11-19 | Disposition: A | Discharge status: Home / Self Care | Payer: Commercial Managed Care - HMO | Attending: Emergency Medicine | Admitting: Emergency Medicine

## 2017-11-19 DIAGNOSIS — T782XXA Anaphylactic shock, unspecified, initial encounter: Secondary | ICD-10-CM

## 2017-11-19 DIAGNOSIS — Y829 Unspecified medical devices associated with adverse incidents: Secondary | ICD-10-CM | POA: Insufficient documentation

## 2017-11-19 DIAGNOSIS — L0292 Furuncle, unspecified: Secondary | ICD-10-CM

## 2017-11-19 DIAGNOSIS — Z79899 Other long term (current) drug therapy: Secondary | ICD-10-CM | POA: Insufficient documentation

## 2017-11-19 DIAGNOSIS — T886XXA Anaphylactic reaction due to adverse effect of correct drug or medicament properly administered, initial encounter: Secondary | ICD-10-CM | POA: Insufficient documentation

## 2017-11-19 DIAGNOSIS — N611 Abscess of the breast and nipple: Secondary | ICD-10-CM | POA: Insufficient documentation

## 2017-11-19 DIAGNOSIS — F17210 Nicotine dependence, cigarettes, uncomplicated: Secondary | ICD-10-CM | POA: Insufficient documentation

## 2017-11-19 MED ORDER — METHYLPREDNISOLONE SODIUM SUCC 125 MG IJ SOLR
125.0000 mg | Freq: Once | INTRAMUSCULAR | Status: AC
  Administered 2017-11-19: 125 mg via INTRAVENOUS

## 2017-11-19 MED ORDER — DIPHENHYDRAMINE HCL 50 MG/ML IJ SOLN
50.0000 mg | Freq: Once | INTRAMUSCULAR | Status: AC
  Administered 2017-11-19: 50 mg via INTRAVENOUS

## 2017-11-19 MED ORDER — PREDNISONE 50 MG PO TABS: ORAL_TABLET | ORAL | 0 refills | Status: DC

## 2017-11-19 MED ORDER — DOXYCYCLINE HYCLATE 100 MG PO TABS
100.0000 mg | ORAL_TABLET | Freq: Once | ORAL | Status: AC
  Administered 2017-11-19: 100 mg via ORAL
  Filled 2017-11-19: qty 1

## 2017-11-19 MED ORDER — EPINEPHRINE 0.3 MG/0.3ML IJ SOAJ: 0.3000 mg | Freq: Once | INTRAMUSCULAR | 0 refills | Status: AC

## 2017-11-19 MED ORDER — DOXYCYCLINE HYCLATE 100 MG PO CAPS: 100.0000 mg | ORAL_CAPSULE | Freq: Two times a day (BID) | ORAL | 0 refills | Status: DC

## 2017-11-19 MED ORDER — FAMOTIDINE IN NACL 20-0.9 MG/50ML-% IV SOLN
20.0000 mg | Freq: Once | INTRAVENOUS | Status: AC
  Administered 2017-11-19: 20 mg via INTRAVENOUS

## 2017-11-19 NOTE — ED Provider Notes (Addendum)
[facility] Emergency Department Provider Note  ___________________________________________   First MD Initiated Contact with Patient 11/19/17 [id number]     (approximate)  I have reviewed the triage vital signs and the nursing notes.   HISTORY  Chief Complaint Allergic Reaction   HPI Isabel Hines is a 52 y.o. female with a history of anxiety and GERD who is presenting after an allergic reaction.  Says that she took 1 tab of Keflex about 30 minutes prior to arrival and immediately began to feel itchy and feels her throat closing.  She says that she feels like it is swollen to the top, back portion of her mouth.  Says that she has taken Keflex about 6 months ago for a lesion on her eye without issues.  Says that she also feels very itchy, diffusely.  Says that she had taken the Keflex for lesion under her left breast which is been getting larger over the past week and a half and slightly painful.  Past Medical History:  Diagnosis Date  . Anxiety   . GERD (gastroesophageal reflux disease)     Patient Active Problem List   Diagnosis Date Noted  . Other specified diseases of intestine   . Left lower quadrant pain   . Insomnia 06/25/2015  . Depression 04/02/2015  . Abnormal cervical Pap smear with positive HPV DNA test 06/10/2014  . Anxiety   . GERD (gastroesophageal reflux disease)     Past Surgical History:  Procedure Laterality Date  . uterine ablation      Prior to Admission medications   Medication Sig Start Date End Date Taking? Authorizing Provider  ALPRAZolam Prudy Feeler) 0.5 MG tablet TAKE 1 TABLET BY MOUTH AS NEEDED FOR SLEEP 03/08/16   Allayne Butcher B, PA-C  atorvastatin (LIPITOR) 10 MG tablet Take 10 mg by mouth daily. 11/04/16   [provider]  cetirizine (ZYRTEC) 10 MG tablet Take 10 mg by mouth daily.    [provider]  citalopram (CELEXA) 20 MG tablet Take 1 daily. 03/08/16   Kokesh, Mary B, PA-C  Flaxseed, Linseed, (FLAX  SEEDS PO) Take 1 tablet by mouth daily.    [provider]  fluticasone (FLONASE) 50 MCG/ACT nasal spray Place into both nostrils daily.    [provider]  ibuprofen (ADVIL,MOTRIN) 800 MG tablet Take 1 tablet (800 mg total) by mouth every 8 (eight) hours as needed. 11/22/16   Phineas Semen, MD  Multiple Vitamin (MULTIVITAMIN) tablet Take 1 tablet by mouth daily.    [provider]    Allergies Keflex [cephalexin]; Hydrocodone; Sulfa antibiotics; and Tramadol  Family History  Problem Relation Age of Onset  . Cancer Mother   . Hyperlipidemia Father     Social History Social History   Tobacco Use  . Smoking status: Current Every Day Smoker    Packs/day: 1.00    Types: Cigarettes  . Smokeless tobacco: Never Used  Substance Use Topics  . Alcohol use: No  . Drug use: No    Review of Systems  Constitutional: No fever/chills Eyes: No visual changes. ENT: No sore throat. Cardiovascular: Denies chest pain. Respiratory: Positive for shortness of breath. Gastrointestinal: No abdominal pain.  No nausea, no vomiting.  No diarrhea.  No constipation. Genitourinary: Negative for dysuria. Musculoskeletal: Negative for back pain. Skin: Erythematous rash diffusely. Neurological: Negative for headaches, focal weakness or numbness.   ____________________________________________   PHYSICAL EXAM:  VITAL SIGNS: ED Triage Vitals  Enc Vitals Group  BP 11/19/17 0912 111/77     Pulse Rate 11/19/17 0912 (!) 111     Resp 11/19/17 0912 20     Temp 11/19/17 0912 98.2 F (36.8 C)     Temp Source 11/19/17 0912 Oral     SpO2 11/19/17 0912 99 %     Weight 11/19/17 0913 176 lb (79.8 kg)     Height 11/19/17 0913  (1.651 m)     Head Circumference --      Peak Flow --      Pain Score --      Pain Loc --      Pain Edu? --      Excl. in GC? --     Constitutional: Alert and oriented.  Anxious with mild distress.  Normal voice.  Controlling  secretions. Eyes: Conjunctivae are normal.  Head: Atraumatic. Nose: No congestion/rhinnorhea. Mouth/Throat: Mucous membranes are moist.  Neck: No stridor.   Cardiovascular: Tachycardic, regular rhythm. Grossly normal heart sounds.  Respiratory: Normal respiratory effort.  No retractions. Lungs CTAB. Gastrointestinal: Soft and nontender. No distention.  Musculoskeletal: No lower extremity tenderness nor edema.  No joint effusions. Neurologic:  Normal speech and language. No gross focal neurologic deficits are appreciated. Skin: Erythema, diffusely, without raised lesions/hives.  Patient itching at her skin.  2 cm, raised, erythematous and tender lesion under the left breast but does not come to ahead or have pus/exudate visualized.  No fluctuance on exam.  Psychiatric: Mood and affect are normal. Speech and behavior are normal.  ____________________________________________   LABS (all labs ordered are listed, but only abnormal results are displayed)  Labs Reviewed - No data to display ____________________________________________  EKG   ____________________________________________  RADIOLOGY   ____________________________________________   PROCEDURES  Procedure(s) performed:   Procedures  Critical Care performed:   ____________________________________________   INITIAL IMPRESSION / ASSESSMENT AND PLAN / ED COURSE  Pertinent labs & imaging results that were available during my care of the patient were reviewed by me and considered in my medical decision making (see chart for details).  DDX: Allergic reaction, angioedema, hives/urticaria, furuncle, abscess, cellulitis, anaphylaxis As part of my medical decision making, I reviewed the following data within the electronic MEDICAL RECORD NUMBER Notes from prior ED visits  ----------------------------------------- 12:47 PM on 11/19/2017 -----------------------------------------  Patient at this time with complete  resolution of her symptoms.  Does not feel any tightening in her throat.  No difficulty breathing.  Rash/redness have completely resolved.  Patient be discharged on the steroids as well as an EpiPen.  For the lesion on her breast I will give her doxycycline and I also recommended warm compresses.  She is understanding of the diagnosis as well as treatment plan willing to comply.  Patient at this time has completed a near 4-hour ops with steady improvement.  Skin lesion appears as a small furuncle.  However, does not appear to have any drainable collection at this time.  I feel that it will be adequately treated with antibiotics. ____________________________________________   FINAL CLINICAL IMPRESSION(S) / ED DIAGNOSES  Anaphylaxis.  Furuncle.    NEW MEDICATIONS STARTED DURING THIS VISIT:  New Prescriptions   No medications on file     Note:  This document was prepared using Dragon voice recognition software and may include unintentional dictation errors.     Myrna Blazer, MD 11/19/17 1247    Pershing Proud Myra Rude, MD 11/19/17 1249

## 2017-11-19 NOTE — ED Triage Notes (Signed)
Pt here with c/o allergic reaction after taking first oral antibiotic this am, states 10 min after taking med, her throat felt like it was swelling, states she is "on fire." Hives and redness noted on 90% of body.  Swelling to eyes noted.

## 2017-11-19 NOTE — ED Notes (Signed)
NAD noted at time of D/C. Pt denies questions or concerns. Pt ambulatory to the lobby at this time. Pt refused wheelchair to the lobby.  

## 2017-11-21 ENCOUNTER — Ambulatory Visit: Payer: Self-pay

## 2017-12-05 ENCOUNTER — Other Ambulatory Visit: Payer: Self-pay | Admitting: Family Medicine

## 2018-01-18 ENCOUNTER — Other Ambulatory Visit: Payer: Self-pay | Admitting: *Deleted

## 2018-01-18 ENCOUNTER — Inpatient Hospital Stay
Admission: RE | Admit: 2018-01-18 | Discharge: 2018-01-18 | Disposition: A | Discharge status: Home / Self Care | Payer: Self-pay | Source: Ambulatory Visit | Attending: *Deleted | Admitting: *Deleted

## 2018-01-18 DIAGNOSIS — Z9289 Personal history of other medical treatment: Secondary | ICD-10-CM

## 2018-01-23 ENCOUNTER — Other Ambulatory Visit: Payer: Self-pay | Admitting: Family Medicine

## 2018-01-23 DIAGNOSIS — R928 Other abnormal and inconclusive findings on diagnostic imaging of breast: Secondary | ICD-10-CM

## 2018-05-02 ENCOUNTER — Other Ambulatory Visit: Payer: Self-pay

## 2018-05-02 ENCOUNTER — Emergency Department: Payer: Managed Care, Other (non HMO)

## 2018-05-02 ENCOUNTER — Emergency Department
Admission: EM | Admit: 2018-05-02 | Discharge: 2018-05-02 | Disposition: A | Discharge status: Home / Self Care | Payer: Managed Care, Other (non HMO) | Attending: Emergency Medicine | Admitting: Emergency Medicine

## 2018-05-02 DIAGNOSIS — R1031 Right lower quadrant pain: Secondary | ICD-10-CM | POA: Diagnosis present

## 2018-05-02 DIAGNOSIS — Z79899 Other long term (current) drug therapy: Secondary | ICD-10-CM | POA: Insufficient documentation

## 2018-05-02 DIAGNOSIS — F17210 Nicotine dependence, cigarettes, uncomplicated: Secondary | ICD-10-CM | POA: Diagnosis not present

## 2018-05-02 HISTORY — DX: Hyperlipidemia, unspecified: E78.5

## 2018-05-02 LAB — COMPREHENSIVE METABOLIC PANEL
ALBUMIN: 4.5 g/dL (ref 3.5–5.0)
ALT: 24 U/L (ref 0–44)
AST: 11 U/L — AB (ref 15–41)
Alkaline Phosphatase: 91 U/L (ref 38–126)
Anion gap: 6 (ref 5–15)
BILIRUBIN TOTAL: 0.5 mg/dL (ref 0.3–1.2)
BUN: 12 mg/dL (ref 6–20)
CO2: 30 mmol/L (ref 22–32)
CREATININE: 0.84 mg/dL (ref 0.44–1.00)
Calcium: 9.3 mg/dL (ref 8.9–10.3)
Chloride: 104 mmol/L (ref 98–111)
GFR calc Af Amer: >60 mL/min (ref >60)
GLUCOSE: 114 mg/dL — AB (ref 70–99)
POTASSIUM: 4.1 mmol/L (ref 3.5–5.1)
Sodium: 140 mmol/L (ref 135–145)
TOTAL PROTEIN: 7.2 g/dL (ref 6.5–8.1)

## 2018-05-02 LAB — CBC
HEMATOCRIT: 41.1 % (ref 35.0–47.0)
Hemoglobin: 14 g/dL (ref 12.0–16.0)
MCH: 29.3 pg (ref 26.0–34.0)
MCHC: 34 g/dL (ref 32.0–36.0)
MCV: 86.3 fL (ref 80.0–100.0)
PLATELETS: 212 10*3/uL (ref 150–440)
RBC: 4.76 MIL/uL (ref 3.80–5.20)
RDW: 15 % — ABNORMAL HIGH (ref 11.5–14.5)
WBC: 5.6 10*3/uL (ref 3.6–11.0)

## 2018-05-02 LAB — URINALYSIS, COMPLETE (UACMP) WITH MICROSCOPIC
Bilirubin Urine: NEGATIVE
Glucose, UA: NEGATIVE mg/dL
Ketones, ur: NEGATIVE mg/dL
Leukocytes, UA: NEGATIVE
NITRITE: NEGATIVE
PH: 7 (ref 5.0–8.0)
Protein, ur: NEGATIVE mg/dL
Specific Gravity, Urine: 1.01 (ref 1.005–1.030)

## 2018-05-02 LAB — LIPASE, BLOOD: Lipase: 35 U/L (ref 11–51)

## 2018-05-02 LAB — PREGNANCY, URINE: PREG TEST UR: NEGATIVE

## 2018-05-02 IMAGING — CT CT ABD-PELV W/ CM
2 of 5 series · 17 of 46 positions shown, 19 images · IV contrast (APPLIED)
Comparison: [DATE]

CLINICAL DATA: Right lower quadrant pain for several days

EXAM:
CT ABDOMEN AND PELVIS WITH CONTRAST
TECHNIQUE: Multidetector CT imaging of the abdomen and pelvis was performed
using the standard protocol following bolus administration of
intravenous contrast.
CONTRAST:  100mL [S3] IOPAMIDOL ([S3]) INJECTION 61%

[Series 2: axial st · axial · 0.76mm/px · z∈[-506,-111]mm · 14 of 89 slices shown, 16 images]
[im 5/89  soft-tissue]
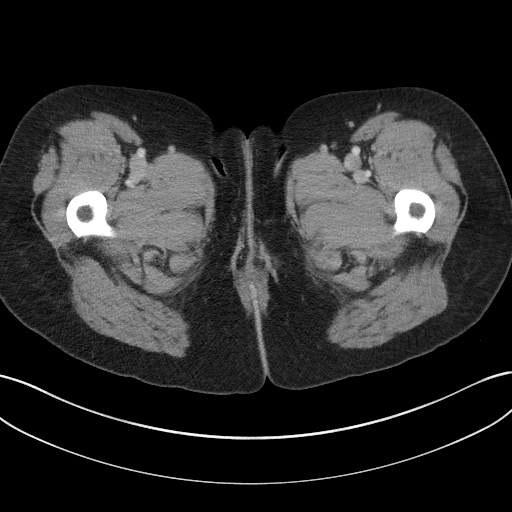
[im 5/89  bone]
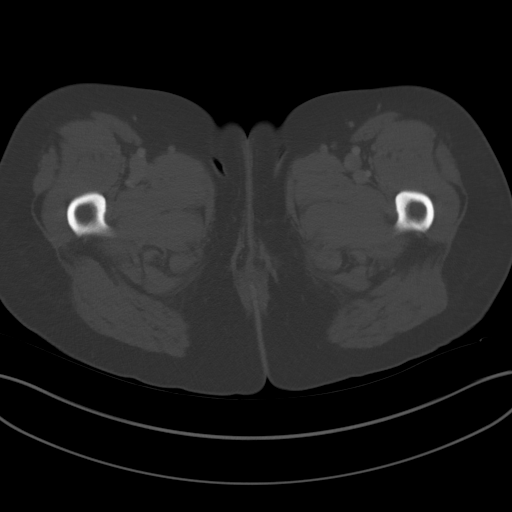
[im 10/89  soft-tissue]
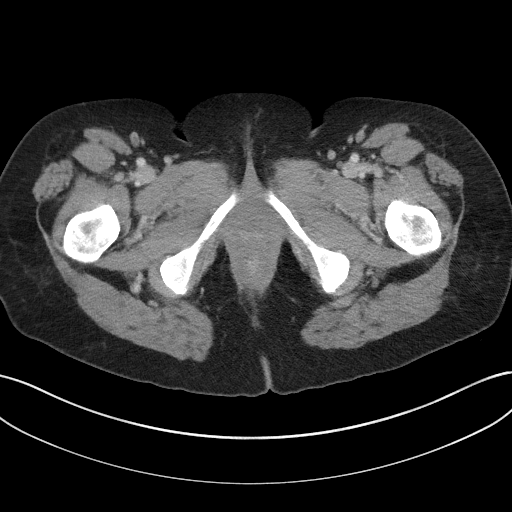
[im 19/89  soft-tissue]
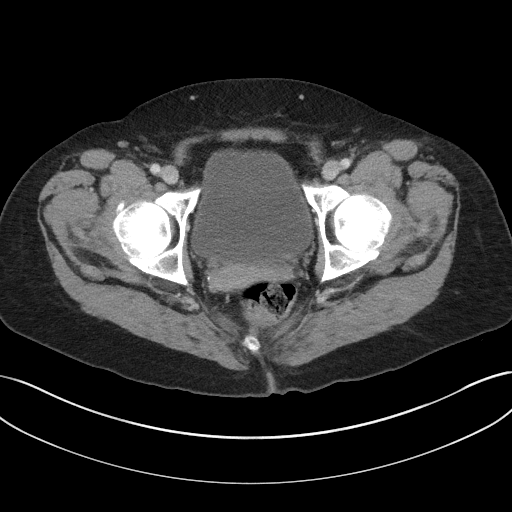
[im 24/89  soft-tissue]
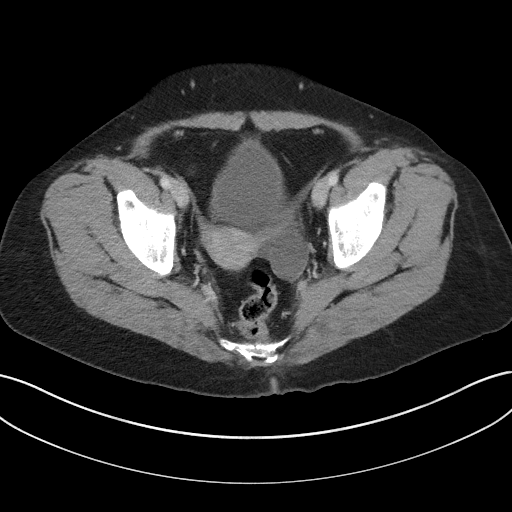
[im 28/89  soft-tissue]
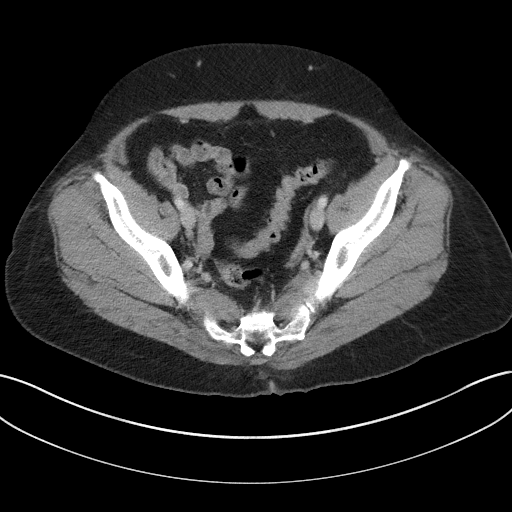
[im 38/89  soft-tissue]
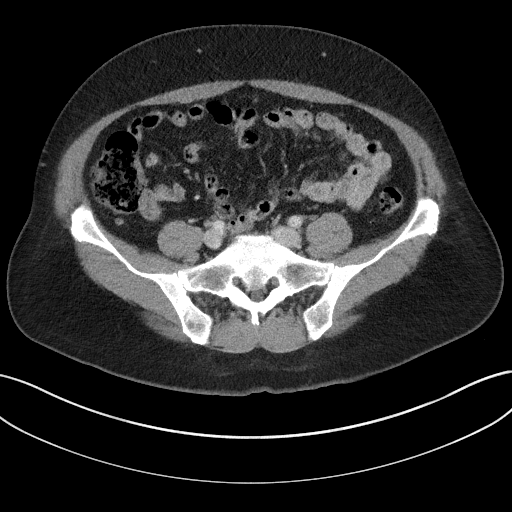
[im 42/89  soft-tissue]
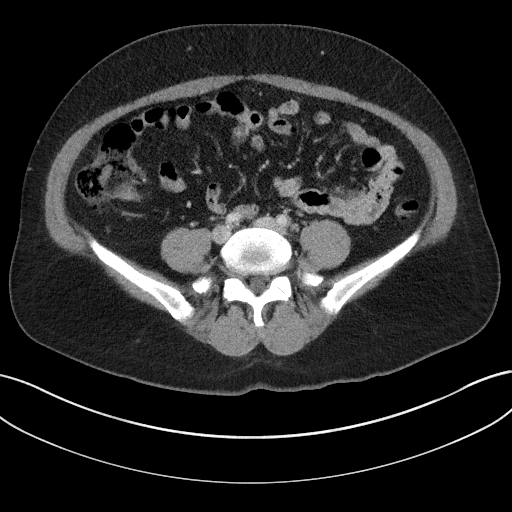
[im 47/89  soft-tissue]
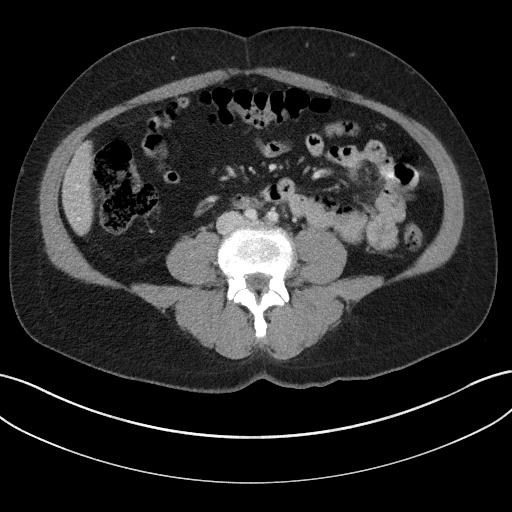
[im 51/89  soft-tissue]
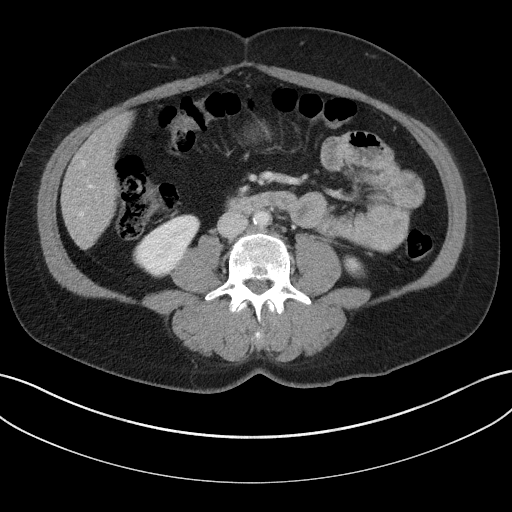
[im 51/89  bone]
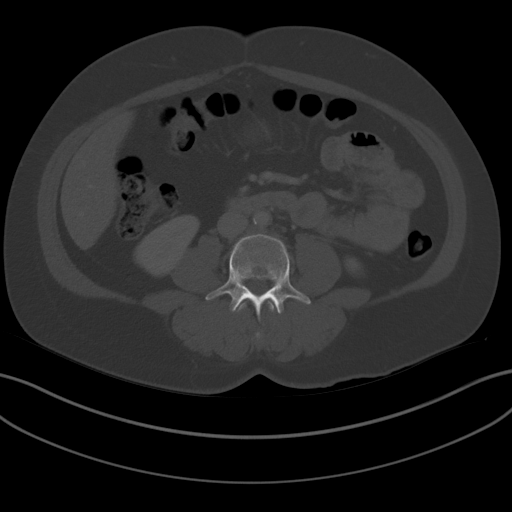
[im 61/89  soft-tissue]
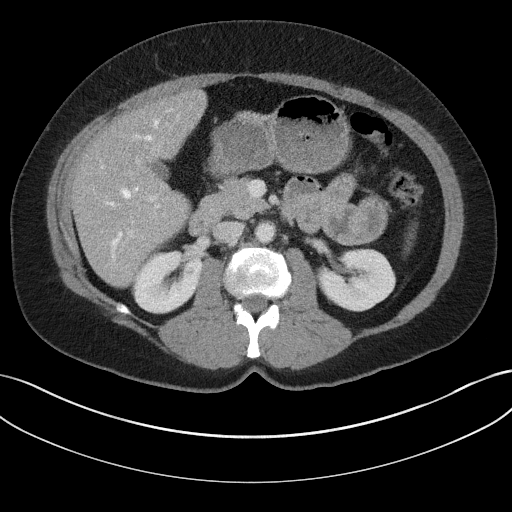
[im 65/89  soft-tissue]
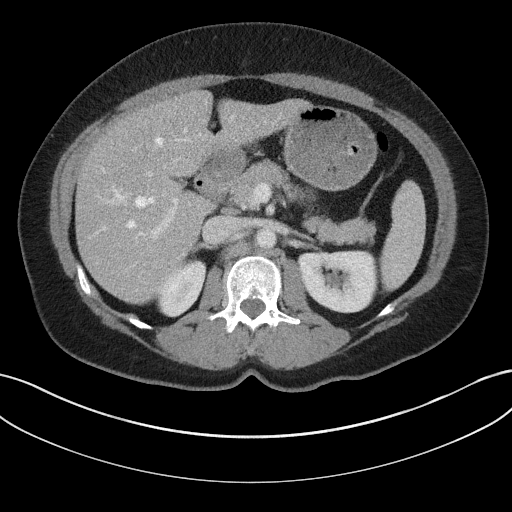
[im 70/89  soft-tissue]
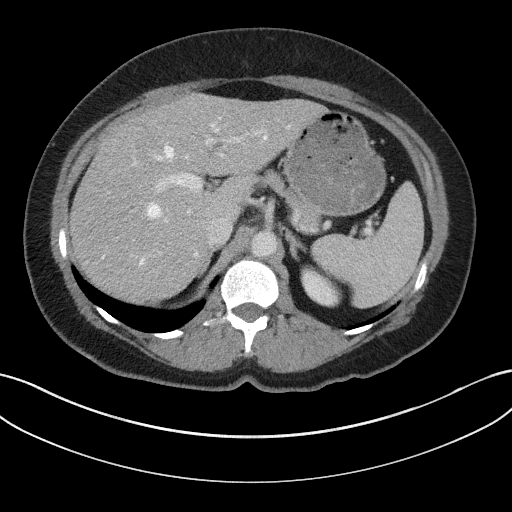
[im 79/89  soft-tissue]
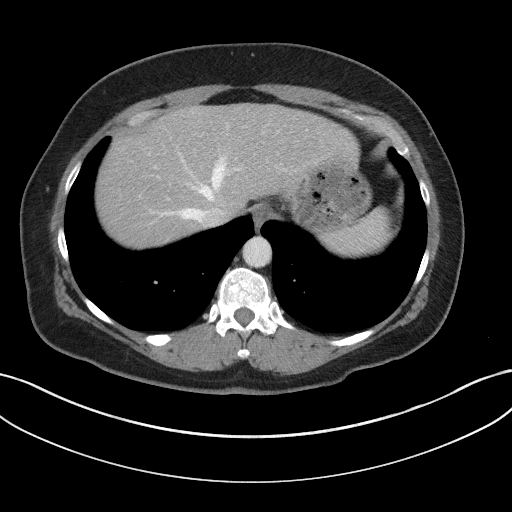
[im 84/89  soft-tissue]
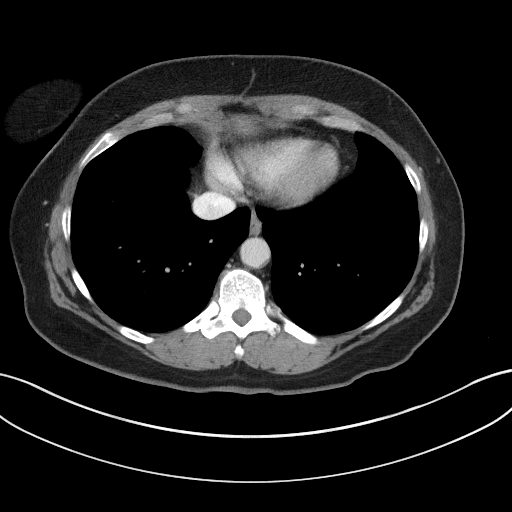

[Series 5: coronal st · coronal · 0.83mm/px · 3 of 94 slices shown]
[im 32/94  soft-tissue]
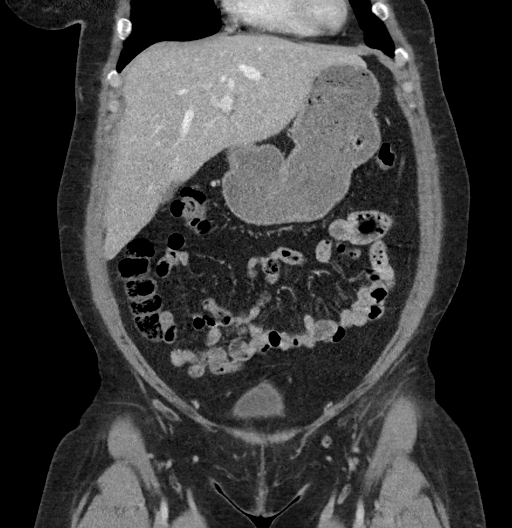
[im 42/94  soft-tissue]
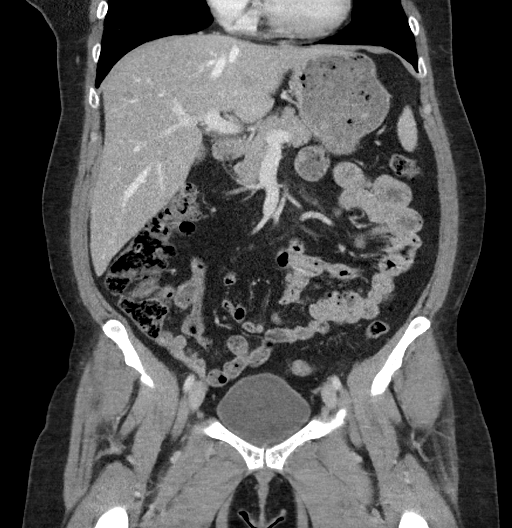
[im 52/94  soft-tissue]
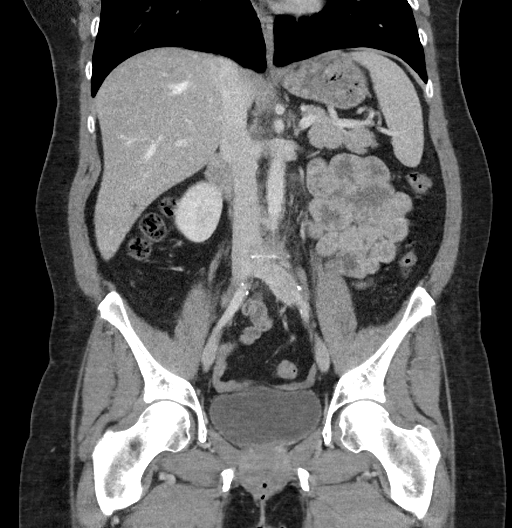

[17 of 46 positions shown; findings below may reference images not displayed]

FINDINGS: Lower chest: No acute abnormality.

Hepatobiliary: No focal liver abnormality is seen. No gallstones,
gallbladder wall thickening, or biliary dilatation.

Pancreas: Unremarkable. No pancreatic ductal dilatation or
surrounding inflammatory changes.

Spleen: Normal in size without focal abnormality.

Adrenals/Urinary Tract: Adrenal glands are within normal limits.
Kidneys are well visualized bilaterally. No renal calculi or
obstructive changes are noted. The bladder is well distended.

Stomach/Bowel: Scattered small diverticula are noted without
evidence of diverticulitis. The appendix is within normal limits.

Vascular/Lymphatic: Aortic atherosclerosis. No enlarged abdominal or
pelvic lymph nodes.

Reproductive: The uterus is within normal limits. The right adnexa
is unremarkable. Stable left ovarian cyst is noted measuring 3.8 cm.

Other: No abdominal wall hernia or abnormality. No abdominopelvic
ascites.

Musculoskeletal: No acute or significant osseous findings.
IMPRESSION: The previously seen inflammatory changes surrounding the descending
colon have resolved.

Stable left ovarian cyst.

No acute abnormality noted.

## 2018-05-02 MED ORDER — IOPAMIDOL (ISOVUE-300) INJECTION 61%
100.0000 mL | Freq: Once | INTRAVENOUS | Status: AC | PRN
  Administered 2018-05-02: 100 mL via INTRAVENOUS

## 2018-05-02 NOTE — ED Notes (Addendum)
Patient taken to CT scan. Patient refused blanket and socks. Large plastic cup of tea noted at bedside.

## 2018-05-02 NOTE — Discharge Instructions (Addendum)
Please return for worse pain fever over 101 or vomiting.  Please follow-up with Dr. Burnett Sheng later on this week.  For now use Motrin or Tylenol for the pain.  Dr. Burnett Sheng may want to consider ultrasound to check on the ovary if nothing else turns up and the pain continues

## 2018-05-02 NOTE — ED Notes (Signed)
Pt back into room now with food delivered from [facility]. No distress noted.

## 2018-05-02 NOTE — ED Provider Notes (Signed)
[facility] Emergency Department Provider Note   ____________________________________________   First MD Initiated Contact with Patient 05/02/18 1104     (approximate)  I have reviewed the triage vital signs and the nursing notes.   HISTORY  Chief Complaint Abdominal Pain    HPI Isabel Hines is a 52 y.o. female patient complains of right lower quadrant pain with nausea and some bloating for about 3 days.  Is been getting worse.  She says she is been running on temperature of 100 at home.  She denies any vomiting or diarrhea.  She is not constipated either she says.  Has had a endometrial ablation in the past but her ovaries are still in.  She reports the pain radiates to her back on the right side patient did order food from [employer] and has had one bite she says she is very hungry.  Patient says she had sepsis from something she ate previously.  Past Medical History:  Diagnosis Date  . Anxiety   . GERD (gastroesophageal reflux disease)   . Hyperlipidemia     Patient Active Problem List   Diagnosis Date Noted  . Other specified diseases of intestine   . Left lower quadrant pain   . Insomnia 06/25/2015  . Depression 04/02/2015  . Abnormal cervical Pap smear with positive HPV DNA test 06/10/2014  . Anxiety   . GERD (gastroesophageal reflux disease)     Past Surgical History:  Procedure Laterality Date  . BUNIONECTOMY    . uterine ablation      Prior to Admission medications   Medication Sig Start Date End Date Taking? Authorizing Provider  ALPRAZolam Prudy Feeler) 0.5 MG tablet TAKE 1 TABLET BY MOUTH AS NEEDED FOR SLEEP 03/08/16   Allayne Butcher B, PA-C  atorvastatin (LIPITOR) 10 MG tablet Take 10 mg by mouth daily. 11/04/16   [provider]  cetirizine (ZYRTEC) 10 MG tablet Take 10 mg by mouth daily.    [provider]  citalopram (CELEXA) 20 MG tablet Take 1 daily. 03/08/16   Dorena Bodo, PA-C  doxycycline (VIBRAMYCIN) 100 MG  capsule Take 1 capsule (100 mg total) by mouth 2 (two) times daily. 11/19/17   Schaevitz, Myra Rude, MD  Flaxseed, Linseed, (FLAX SEEDS PO) Take 1 tablet by mouth daily.    [provider]  fluticasone (FLONASE) 50 MCG/ACT nasal spray Place into both nostrils daily.    [provider]  ibuprofen (ADVIL,MOTRIN) 800 MG tablet Take 1 tablet (800 mg total) by mouth every 8 (eight) hours as needed. 11/22/16   Phineas Semen, MD  Multiple Vitamin (MULTIVITAMIN) tablet Take 1 tablet by mouth daily.    [provider]  predniSONE (DELTASONE) 50 MG tablet Take 1 tab daily x7 days 11/19/17   Pershing Proud Myra Rude, MD    Allergies Keflex [cephalexin]; Hydrocodone; Sulfa antibiotics; and Tramadol  Family History  Problem Relation Age of Onset  . Cancer Mother   . Hyperlipidemia Father     Social History Social History   Tobacco Use  . Smoking status: Current Every Day Smoker    Packs/day: 1.00    Types: Cigarettes  . Smokeless tobacco: Never Used  Substance Use Topics  . Alcohol use: No  . Drug use: No    Review of Systems  Constitutional: Patient reports temperature 100 at home Eyes: No visual changes. ENT: No sore throat. Cardiovascular: Denies chest pain. Respiratory: Denies shortness of breath. Gastrointestinal: Right lower quadrant abdominal pain. nausea, no  vomiting.  No diarrhea.  No constipation. Genitourinary: Negative for dysuria. Musculoskeletal: Negative for back pain. Skin: Negative for rash. Neurological: Negative for headaches, focal weakness   ____________________________________________   PHYSICAL EXAM:  VITAL SIGNS: ED Triage Vitals [05/02/18 0914]  Enc Vitals Group     BP 119/62     Pulse Rate 82     Resp 17     Temp 98.9 F (37.2 C)     Temp Source Oral     SpO2 97 %     Weight 175 lb (79.4 kg)     Height 5\' 5"  (1.651 m)     Head Circumference      Peak Flow      Pain Score 4     Pain Loc      Pain Edu?       Excl. in GC?     Constitutional: Alert and oriented. Well appearing and in no acute distress. Eyes: Conjunctivae are normal. Head: Atraumatic. Nose: No congestion/rhinnorhea. Mouth/Throat: Mucous membranes are moist.  Oropharynx non-erythematous. Neck: No stridor.   Cardiovascular: Normal rate, regular rhythm. Grossly normal heart sounds.  Good peripheral circulation. Respiratory: Normal respiratory effort.  No retractions. Lungs CTAB. Gastrointestinal: Soft tender to palpation percussion on the right side and right lower quadrant no distention. No abdominal bruits. No CVA tenderness. Musculoskeletal: No lower extremity tenderness nor edema.   Neurologic:  Normal speech and language. No gross focal neurologic deficits are appreciated. No gait instability. Skin:  Skin is warm, dry and intact. No rash noted. Psychiatric: Mood and affect are normal. Speech and behavior are normal.  ____________________________________________   LABS (all labs ordered are listed, but only abnormal results are displayed)  Labs Reviewed  COMPREHENSIVE METABOLIC PANEL - Abnormal; Notable for the following components:      Result Value   Glucose, Bld 114 (*)    AST 11 (*)    All other components within normal limits  CBC - Abnormal; Notable for the following components:   RDW 15.0 (*)    All other components within normal limits  URINALYSIS, COMPLETE (UACMP) WITH MICROSCOPIC - Abnormal; Notable for the following components:   Color, Urine YELLOW (*)    APPearance CLEAR (*)    Hgb urine dipstick SMALL (*)    Bacteria, UA RARE (*)    All other components within normal limits  LIPASE, BLOOD  PREGNANCY, URINE   ____________________________________________  EKG   ____________________________________________  RADIOLOGY  ED MD interpretation: CT read by radiology shows no acute pathology.  Official radiology report(s): Ct Abdomen Pelvis W Contrast  Result Date: 05/02/2018 CLINICAL DATA:  Right  lower quadrant pain for several days EXAM: CT ABDOMEN AND PELVIS WITH CONTRAST TECHNIQUE: Multidetector CT imaging of the abdomen and pelvis was performed using the standard protocol following bolus administration of intravenous contrast. CONTRAST:  ISOVUE-300 IOPAMIDOL (ISOVUE-300) INJECTION 61% COMPARISON:  11/22/2016 FINDINGS: Lower chest: No acute abnormality. Hepatobiliary: No focal liver abnormality is seen. No gallstones, gallbladder wall thickening, or biliary dilatation. Pancreas: Unremarkable. No pancreatic ductal dilatation or surrounding inflammatory changes. Spleen: Normal in size without focal abnormality. Adrenals/Urinary Tract: Adrenal glands are within normal limits. Kidneys are well visualized bilaterally. No renal calculi or obstructive changes are noted. The bladder is well distended. Stomach/Bowel: Scattered small diverticula are noted without evidence of diverticulitis. The appendix is within normal limits. Vascular/Lymphatic: Aortic atherosclerosis. No enlarged abdominal or pelvic lymph nodes. Reproductive: The uterus is within normal limits. The right adnexa is unremarkable. Stable  left ovarian cyst is noted measuring 3.8 cm. Other: No abdominal wall hernia or abnormality. No abdominopelvic ascites. Musculoskeletal: No acute or significant osseous findings. IMPRESSION: The previously seen inflammatory changes surrounding the descending colon have resolved. Stable left ovarian cyst. No acute abnormality noted. Electronically Signed   By: Alcide Clever M.D.   On: 05/02/2018 12:10    ____________________________________________   PROCEDURES  Procedure(s) performed:   Procedures  Critical Care performed:   ____________________________________________   INITIAL IMPRESSION / ASSESSMENT AND PLAN / ED COURSE  Patient's pain is better.  CT is negative labs are negative discussed with patient the fact possibly there is something going on with the ovary.  She does not feel like  that is what it is she does not want to do an ultrasound now and the pain is better so I will let her go.  She will follow-up with her doctor Dr. Burnett Sheng at [facility].  She will return here if she is worse.        ____________________________________________   FINAL CLINICAL IMPRESSION(S) / ED DIAGNOSES  Final diagnoses:  Right lower quadrant abdominal pain     ED Discharge Orders    None       Note:  This document was prepared using Dragon voice recognition software and may include unintentional dictation errors.    Arnaldo Natal, MD 05/02/18 1225

## 2018-05-02 NOTE — ED Notes (Signed)
I was asked by Charge Nurse to step into room 19 and have a discussion with pt concerning a complaint she had voiced, and later asked that someone come in a talk with her about same. I did meet with pt and listen to her concerns, then reported back to charge nurse the results. Pt was calm while we talked, and stated she was satisfied with our discussion.

## 2018-05-02 NOTE — ED Notes (Signed)
Pt ordered [employer], waiting for food to come.

## 2018-05-02 NOTE — ED Triage Notes (Signed)
Pt c/o RLQ pain with nausea and bloating for the past 3 days. Denies vomiting or diarrhea.Marland Kitchen

## 2018-05-02 NOTE — ED Notes (Signed)
Pt walked back to room 19, [employer] notified. While walking the pt back to the room the pt states "I have ordered [employer] what should I do when they get here"

## 2018-05-09 ENCOUNTER — Ambulatory Visit
Admission: RE | Admit: 2018-05-09 | Discharge: 2018-05-09 | Disposition: A | Discharge status: Home / Self Care | Payer: Managed Care, Other (non HMO) | Source: Ambulatory Visit | Attending: Family Medicine | Admitting: Family Medicine

## 2018-05-09 DIAGNOSIS — R928 Other abnormal and inconclusive findings on diagnostic imaging of breast: Secondary | ICD-10-CM | POA: Diagnosis present

## 2018-05-09 IMAGING — MG MM DIGITAL DIAGNOSTIC BILAT W/ TOMO W/ CAD
8 series · 8 of 24 positions shown · non-contrast
Comparison: Outside mammogram and ultrasound dated [DATE],
outside ultrasound dated [DATE] and outside ultrasound dated
[DATE].

CLINICAL DATA: Follow-up for probably benign masses within each
breast. Outside diagnostic mammogram and ultrasound report of
[DATE] describes probably benign cysts within each breast,
described as stable compared to other previous outside exams dating
back to [4E], for which a follow-up diagnostic mammogram was
recommended in 1 year.Patient returns today for the follow-up
diagnostic exam.

EXAM:
DIGITAL DIAGNOSTIC BILATERAL MAMMOGRAM WITH CAD AND TOMO

[R MLO synth-2D]
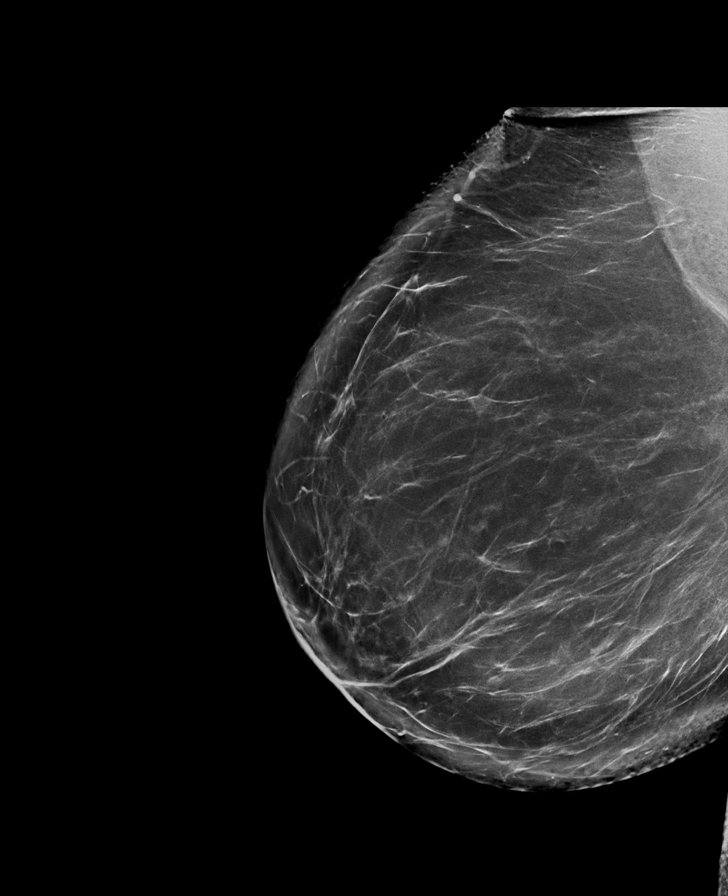

[L CC synth-2D]
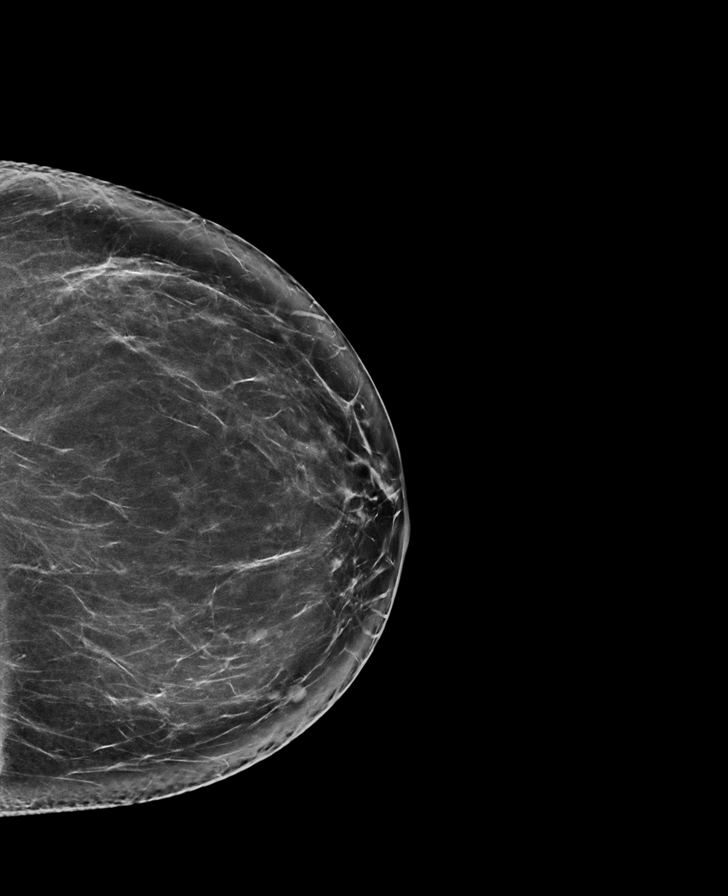

[L MLO synth-2D]
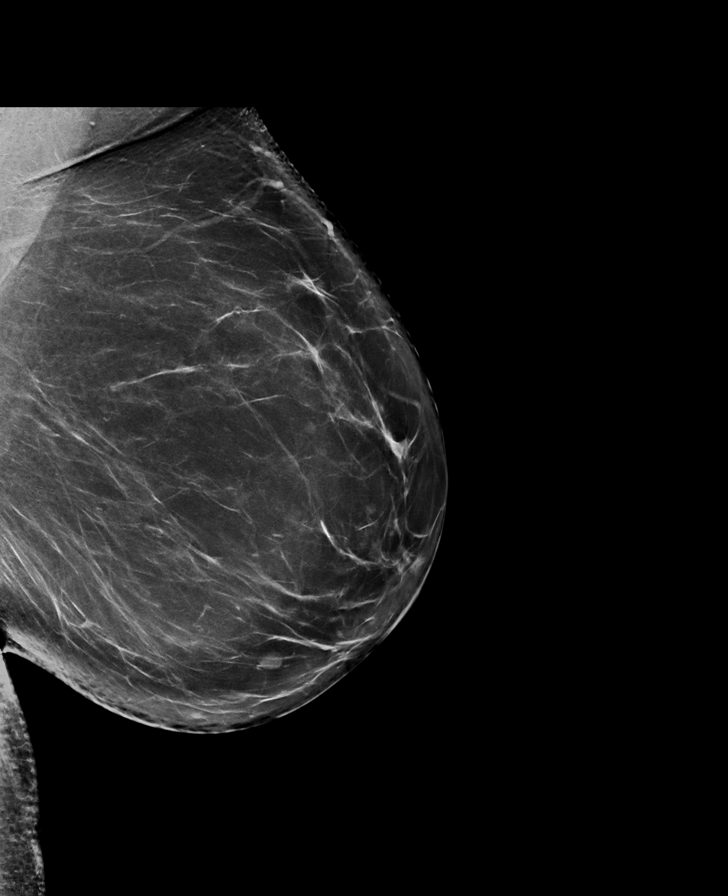

[R CC synth-2D]
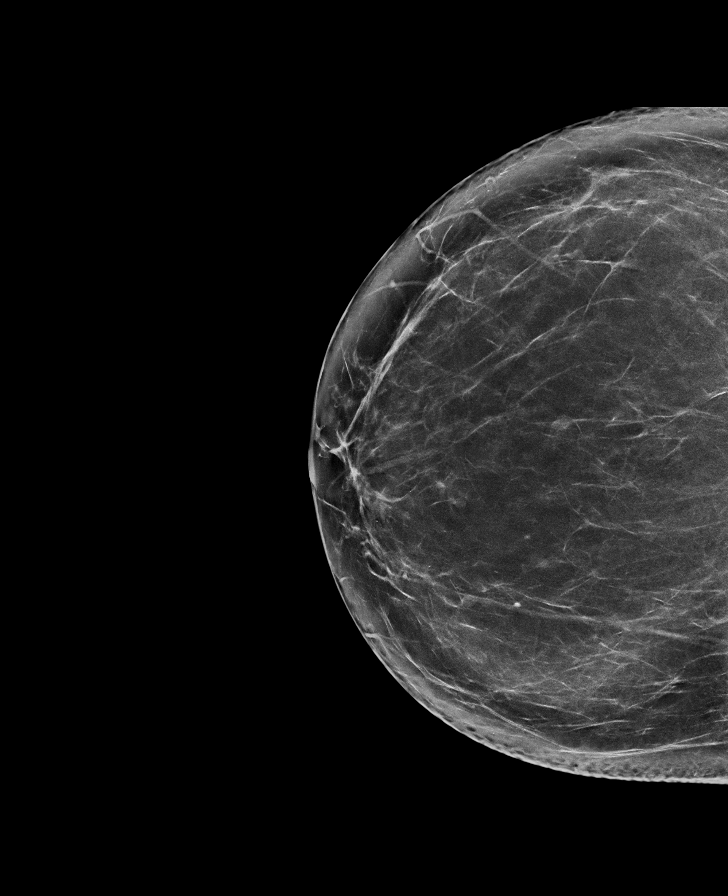

[R MLO tomo · tomo slice 50/99.0]
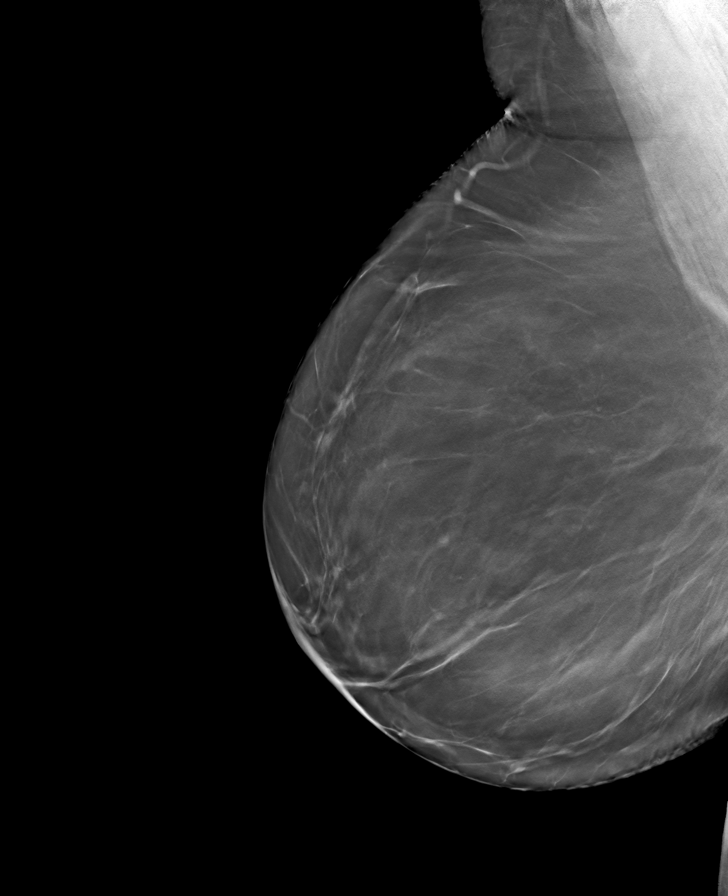

[R CC tomo · tomo slice 44/87.0]
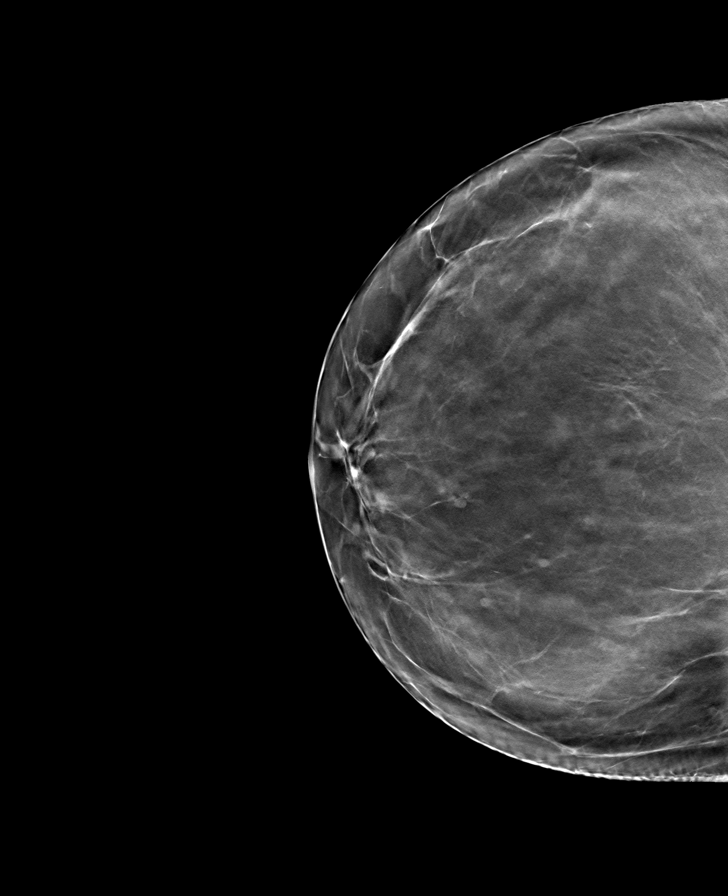

[L CC tomo · tomo slice 43/84.0]
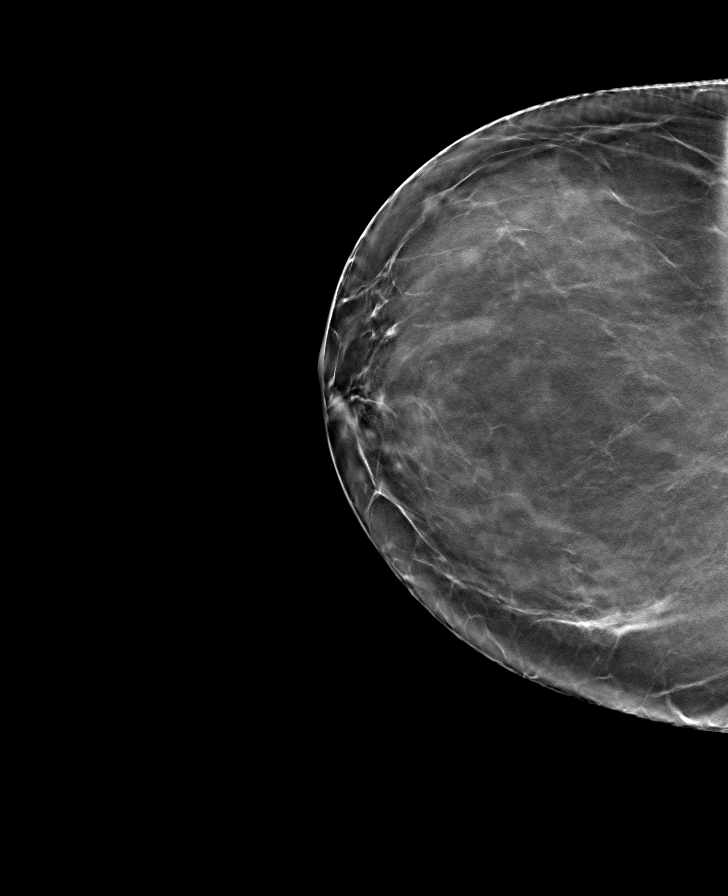

[L MLO tomo · tomo slice 49/96.0]
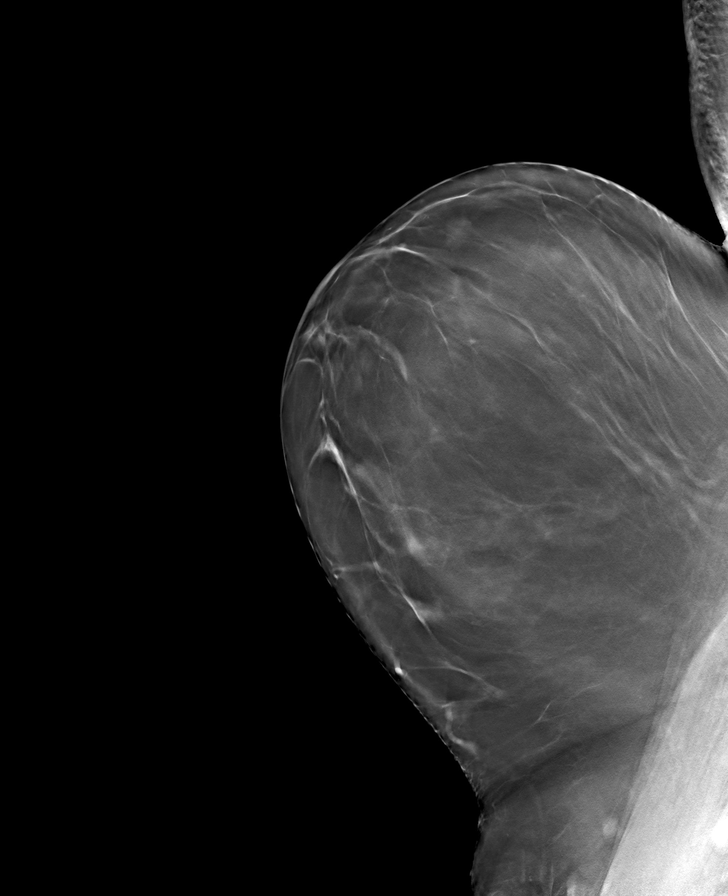

[8 of 24 positions shown; findings below may reference images not displayed]

ACR Breast Density Category b: There are scattered areas of
fibroglandular density.
FINDINGS: There are multiple bilateral circumscribed masses within each
breast, stable compared to patient's previous of mammogram of
[DATE], corresponding to the benign cysts demonstrated on
multiple prior ultrasounds. There are no new dominant masses,
suspicious calcifications or secondary signs of malignancy within
either breast.

Mammographic images were processed with CAD.
IMPRESSION: No evidence of malignancy within either breast. Multiple bilateral
circumscribed masses within each breast, stable mammographically
compared to patient's outside mammogram dated [DATE] (nearly 2
years) and stable by ultrasound for 3 years, confirming benign
cysts.

Patient may return to routine annual bilateral screening mammogram
schedule.

RECOMMENDATION:
Screening mammogram in one year.(Code:[4E])

I have discussed the findings and recommendations with the patient.
Results were also provided in writing at the conclusion of the
visit. If applicable, a reminder letter will be sent to the patient
regarding the next appointment.

BI-RADS CATEGORY  2: Benign.

## 2018-05-27 ENCOUNTER — Encounter: Payer: Self-pay | Admitting: Emergency Medicine

## 2018-05-27 ENCOUNTER — Emergency Department
Admission: EM | Admit: 2018-05-27 | Discharge: 2018-05-27 | Disposition: A | Discharge status: Home / Self Care | Payer: Managed Care, Other (non HMO) | Attending: Emergency Medicine | Admitting: Emergency Medicine

## 2018-05-27 ENCOUNTER — Other Ambulatory Visit: Payer: Self-pay

## 2018-05-27 DIAGNOSIS — F17210 Nicotine dependence, cigarettes, uncomplicated: Secondary | ICD-10-CM | POA: Diagnosis not present

## 2018-05-27 DIAGNOSIS — B86 Scabies: Secondary | ICD-10-CM | POA: Insufficient documentation

## 2018-05-27 DIAGNOSIS — L299 Pruritus, unspecified: Secondary | ICD-10-CM | POA: Diagnosis present

## 2018-05-27 DIAGNOSIS — Z79899 Other long term (current) drug therapy: Secondary | ICD-10-CM | POA: Diagnosis not present

## 2018-05-27 MED ORDER — IVERMECTIN 3 MG PO TABS
15.0000 mg | ORAL_TABLET | Freq: Once | ORAL | Status: AC
  Administered 2018-05-27: 15 mg via ORAL
  Filled 2018-05-27: qty 5

## 2018-05-27 MED ORDER — PERMETHRIN 5 % EX CREA: 1.0000 "application " | TOPICAL_CREAM | Freq: Once | CUTANEOUS | 0 refills | Status: AC

## 2018-05-27 MED ORDER — HYDROXYZINE HCL 25 MG PO TABS
100.0000 mg | ORAL_TABLET | Freq: Once | ORAL | Status: AC
  Administered 2018-05-27: 100 mg via ORAL
  Filled 2018-05-27: qty 4

## 2018-05-27 MED ORDER — PERMETHRIN 1 % EX LOTN: 1.0000 "application " | TOPICAL_LOTION | Freq: Once | CUTANEOUS | 0 refills | Status: AC

## 2018-05-27 NOTE — Discharge Instructions (Signed)
Scabies is extremely contagious and everyone who lives at your house is at risk for catching it.  All of your linens and all of your close that you have worn need to be washed in extremely hot water as that the only way to kill the parasite.  Please put your permethrin on your skin and leave it for 8 to 12 hours before washing it off.  You can re-dose yourself in 1 week if your symptoms persist.  Even better than using Benadryl is over-the-counter Zyrtec (cetirizine).  It works better for itching and does not make you as sleepy.  Please take 10 mg by mouth 4 times a day.  I understand the bottle says to take it once a day but for severe itching like this is safe to take 4 times.  It was a pleasure to take care of you today, and thank you for coming to our emergency department.  If you have any questions or concerns before leaving please ask the nurse to grab me and I'm more than happy to go through your aftercare instructions again.  If you have any concerns once you are home that you are not improving or are in fact getting worse before you can make it to your follow-up appointment, please do not hesitate to call 911 and come back for further evaluation.  Merrily Brittle, MD

## 2018-05-27 NOTE — ED Triage Notes (Signed)
Pt ambulatory to triage with no difficulty. Pt reports woke up about 30 mins PTA with itching and feeling like her lips are swelling. Pt reports she too two benadryl at home. Pt scratching arms and legs at this time. Pt talking in full and complete sentences with no difficulty.

## 2018-05-27 NOTE — ED Provider Notes (Signed)
[facility] Emergency Department Provider Note  ____________________________________________   First MD Initiated Contact with Patient 05/27/18 0636     (approximate)  I have reviewed the triage vital signs and the nursing notes.   HISTORY  Chief Complaint Allergic Reaction    HPI Isabel Hines is a 52 y.o. female who self presents to the emergency department with 30 minutes of sudden onset severe intense itching across her arms legs chest and abdomen.  She is noted multiple small "spots" along her arms for the past month or so however her symptoms have not been as severe until this morning.  She denies shortness of breath.  She does feel like she has "tingling" in her lips.  She took 2 Benadryl prior to arrival.  She does not feel the Benadryl helped very much.  She does have a history of multiple allergies although did not administer an epinephrine shot at home.  No one else at home has similar symptoms.  She did go camping about a month ago shortly prior to her symptoms initiation.  She denies chest pain.  She denies abdominal pain nausea or vomiting.  Her symptoms tonight was sudden onset severe diffuse nonradiating and nothing seems to make them better or worse.    Past Medical History:  Diagnosis Date  . Anxiety   . GERD (gastroesophageal reflux disease)   . Hyperlipidemia     Patient Active Problem List   Diagnosis Date Noted  . Other specified diseases of intestine   . Left lower quadrant pain   . Insomnia 06/25/2015  . Depression 04/02/2015  . Abnormal cervical Pap smear with positive HPV DNA test 06/10/2014  . Anxiety   . GERD (gastroesophageal reflux disease)     Past Surgical History:  Procedure Laterality Date  . BREAST BIOPSY Left 2003   neg  . BUNIONECTOMY    . uterine ablation      Prior to Admission medications   Medication Sig Start Date End Date Taking? Authorizing Provider  ALPRAZolam Prudy Feeler) 0.5 MG tablet TAKE 1  TABLET BY MOUTH AS NEEDED FOR SLEEP 03/08/16   Allayne Butcher B, PA-C  atorvastatin (LIPITOR) 10 MG tablet Take 10 mg by mouth daily. 11/04/16   [provider]  cetirizine (ZYRTEC) 10 MG tablet Take 10 mg by mouth daily.    [provider]  citalopram (CELEXA) 20 MG tablet Take 1 daily. 03/08/16   Dorena Bodo, PA-C  doxycycline (VIBRAMYCIN) 100 MG capsule Take 1 capsule (100 mg total) by mouth 2 (two) times daily. 11/19/17   Schaevitz, Myra Rude, MD  Flaxseed, Linseed, (FLAX SEEDS PO) Take 1 tablet by mouth daily.    [provider]  fluticasone (FLONASE) 50 MCG/ACT nasal spray Place into both nostrils daily.    [provider]  ibuprofen (ADVIL,MOTRIN) 800 MG tablet Take 1 tablet (800 mg total) by mouth every 8 (eight) hours as needed. 11/22/16   Phineas Semen, MD  Multiple Vitamin (MULTIVITAMIN) tablet Take 1 tablet by mouth daily.    [provider]  permethrin (ELIMITE) 1 % lotion Apply 1 application topically once for 1 dose. Shampoo, rinse and towel dry hair, saturate hair and scalp with permethrin. Rinse after 10 min; repeat in 1 week if needed 05/27/18 05/27/18  Merrily Brittle, MD  permethrin (ELIMITE) 5 % cream Apply 1 application topically once for 1 dose. 05/27/18 05/27/18  Merrily Brittle, MD  predniSONE (DELTASONE) 50 MG tablet Take 1 tab daily x7 days  11/19/17   Schaevitz, Myra Rude, MD    Allergies Keflex [cephalexin]; Hydrocodone; Sulfa antibiotics; and Tramadol  Family History  Problem Relation Age of Onset  . Cancer Mother   . Hyperlipidemia Father   . Breast cancer Neg Hx     Social History Social History   Tobacco Use  . Smoking status: Current Every Day Smoker    Packs/day: 1.00    Types: Cigarettes  . Smokeless tobacco: Never Used  Substance Use Topics  . Alcohol use: No  . Drug use: No    Review of Systems Constitutional: No fever/chills Eyes: No visual changes. ENT: No sore throat. Cardiovascular: Denies  chest pain. Respiratory: Denies shortness of breath. Gastrointestinal: No abdominal pain.  No nausea, no vomiting.  No diarrhea.  No constipation. Genitourinary: Negative for dysuria. Musculoskeletal: Negative for back pain. Skin: Positive for rash Neurological: Negative for headaches, focal weakness or numbness.   ____________________________________________   PHYSICAL EXAM:  VITAL SIGNS: ED Triage Vitals  Enc Vitals Group     BP 05/27/18 0628 (!) 157/99     Pulse Rate 05/27/18 0628 91     Resp 05/27/18 0628 18     Temp 05/27/18 0628 97.6 F (36.4 C)     Temp Source 05/27/18 0628 Oral     SpO2 05/27/18 0628 98 %     Weight 05/27/18 0621 175 lb (79.4 kg)     Height 05/27/18 0621 5\' 5"  (1.651 m)     Head Circumference --      Peak Flow --      Pain Score 05/27/18 0621 0     Pain Loc --      Pain Edu? --      Excl. in GC? --     Constitutional: Alert and oriented x4 appears uncomfortable itching across her arms chest and abdomen Eyes: PERRL EOMI. Head: Atraumatic. Nose: No congestion/rhinnorhea. Mouth/Throat: No trismus uvula midline no pharyngeal erythema or exudate Neck: No stridor.   Cardiovascular: Normal rate, regular rhythm. Grossly normal heart sounds.  Good peripheral circulation. Respiratory: Normal respiratory effort.  No retractions. Lungs CTAB and moving good air Gastrointestinal: Soft nontender Musculoskeletal: No lower extremity edema   Neurologic:  Normal speech and language. No gross focal neurologic deficits are appreciated. Skin: Multiple excoriations across her chest abdomen and arms.  In between her fingers on the dorsal surface between the webs there appears to be small lesions which are consistent with scabies Psychiatric: Mood and affect are normal. Speech and behavior are normal.    ____________________________________________   DIFFERENTIAL includes but not limited to  Scabies, bedbugs, allergic reaction,  anaphylaxis ____________________________________________   LABS (all labs ordered are listed, but only abnormal results are displayed)  Labs Reviewed - No data to display   __________________________________________  EKG   ____________________________________________  RADIOLOGY   ____________________________________________   PROCEDURES  Procedure(s) performed: no  Procedures  Critical Care performed: no  ____________________________________________   INITIAL IMPRESSION / ASSESSMENT AND PLAN / ED COURSE  Pertinent labs & imaging results that were available during my care of the patient were reviewed by me and considered in my medical decision making (see chart for details).   As part of my medical decision making, I reviewed the following data within the electronic MEDICAL RECORD NUMBER History obtained from family if available, nursing notes, old chart and ekg, as well as notes from prior ED visits.  The patient's symptoms are consistent with scabies infestation.  Her lungs are clear she has  no urticaria and this does not seem allergic.  I had a lengthy discussion with the patient regarding the importance of washing all of her clothes and linen in hot water and that everyone at home is at risk for infestation as well.  I reviewed her recent lab work and she has normal LFTs so I gave her a first dose of ivermectin here and permethrin prescribed for home.  She is discharged home in improved condition following 100 mg of Atarax for itching.      ____________________________________________   FINAL CLINICAL IMPRESSION(S) / ED DIAGNOSES  Final diagnoses:  Scabies infestation      NEW MEDICATIONS STARTED DURING THIS VISIT:  Discharge Medication List as of 05/27/2018  6:40 AM    START taking these medications   Details  permethrin (ELIMITE) 1 % lotion Apply 1 application topically once for 1 dose. Shampoo, rinse and towel dry hair, saturate hair and scalp with  permethrin. Rinse after 10 min; repeat in 1 week if needed, Starting Sat 05/27/2018, Print    permethrin (ELIMITE) 5 % cream Apply 1 application topically once for 1 dose., Starting Sat 05/27/2018, Print         Note:  This document was prepared using Dragon voice recognition software and may include unintentional dictation errors.     Merrily Brittle, MD 05/27/18 [phone]

## 2018-08-16 ENCOUNTER — Ambulatory Visit: Payer: Self-pay | Admitting: Adult Health

## 2018-08-16 VITALS — BP 120/68 | HR 83 | Temp 98.5°F | Resp 14

## 2018-08-16 DIAGNOSIS — R6889 Other general symptoms and signs: Secondary | ICD-10-CM

## 2018-08-16 LAB — POCT INFLUENZA A/B
INFLUENZA A, POC: NEGATIVE
Influenza B, POC: NEGATIVE

## 2018-08-16 MED ORDER — AMOXICILLIN 875 MG PO TABS: 875.0000 mg | ORAL_TABLET | Freq: Two times a day (BID) | ORAL | 0 refills | Status: DC

## 2018-08-16 NOTE — Progress Notes (Signed)
Subjective:    Patient ID: Isabel Hines, female   DOB: September 24, 1965, 53 y.o.   MRN: [id number]  Blood pressure 120/68, pulse 83, temperature 98.5 F (36.9 C), temperature source Oral, resp. rate 14, SpO2 98 %. Patient is a 53 year old female in no acute distress who comes to the clinic with complaints of   URI   This is a new problem. The current episode started in the past 7 days (two days ago ). The problem has been unchanged (2 days ago - started with sorethroat She did have flu vaccine ). The maximum temperature recorded prior to her arrival was 100.4 - 100.9 F. Associated symptoms include congestion, coughing, headaches, rhinorrhea and a sore throat. Pertinent negatives include no abdominal pain, chest pain, diarrhea, dysuria, ear pain, joint pain, joint swelling, nausea, neck pain, plugged ear sensation, rash, sinus pain, sneezing, swollen glands, vomiting or wheezing. Treatments tried: thera flu  The treatment provided moderate relief.    Her sister and coworkers have been sick. Denies any recent illness or hospitalization.   Patient  denies any chills, rash, chest pain, shortness of breath, nausea, vomiting, or diarrhea.  No LMP recorded. Patient has had an ablation.      Allergies  Allergen Reactions  . Keflex [Cephalexin] Hives  . Sulfa Antibiotics Hives and Itching  . Sulfasalazine Hives  . Hydrocodone Itching  . Tramadol Hives    Patient Active Problem List   Diagnosis Date Noted  . Other specified diseases of intestine   . Left lower quadrant pain   . Insomnia 06/25/2015  . Depression 04/02/2015  . Abnormal cervical Pap smear with positive HPV DNA test 06/10/2014  . Anxiety   . GERD (gastroesophageal reflux disease)      Current Outpatient Medications:  .  ALPRAZolam (XANAX) 0.5 MG tablet, TAKE 1 TABLET BY MOUTH AS NEEDED FOR SLEEP, Disp: 30 tablet, Rfl: 2 .  atorvastatin (LIPITOR) 10 MG tablet, Take 10 mg by mouth daily., Disp: , Rfl:  .  azelastine  (ASTELIN) 0.1 % nasal spray, U 1 TO 2 SPRAYS IEN BID, Disp: , Rfl:  .  azelastine (OPTIVAR) 0.05 % ophthalmic solution, , Disp: , Rfl:  .  cetirizine (ZYRTEC) 10 MG tablet, Take 10 mg by mouth daily., Disp: , Rfl:  .  citalopram (CELEXA) 20 MG tablet, Take 1 daily., Disp: 90 tablet, Rfl: 2 .  Flaxseed, Linseed, (FLAX SEEDS PO), Take 1 tablet by mouth daily., Disp: , Rfl:  .  fluticasone (FLONASE) 50 MCG/ACT nasal spray, Place into both nostrils daily., Disp: , Rfl:  .  hydrOXYzine (ATARAX/VISTARIL) 25 MG tablet, TK 1 T PO Q 8 H, Disp: , Rfl:  .  ibuprofen (ADVIL,MOTRIN) 800 MG tablet, Take 1 tablet (800 mg total) by mouth every 8 (eight) hours as needed., Disp: 30 tablet, Rfl: 0 .  montelukast (SINGULAIR) 10 MG tablet, TK 1 T PO NIGHTLY, Disp: , Rfl:  .  Multiple Vitamin (MULTIVITAMIN) tablet, Take 1 tablet by mouth daily., Disp: , Rfl:  .  predniSONE (DELTASONE) 50 MG tablet, Take 1 tab daily x7 days, Disp: 7 tablet, Rfl: 0 .  triamcinolone ointment (KENALOG) 0.1 %, , Disp: , Rfl:     Review of Systems  HENT: Positive for congestion, rhinorrhea and sore throat. Negative for ear pain, sinus pain and sneezing.   Respiratory: Positive for cough. Negative for wheezing.   Cardiovascular: Negative for chest pain.  Gastrointestinal: Negative for abdominal pain, diarrhea, nausea and vomiting.  Genitourinary:  Negative for dysuria.  Musculoskeletal: Negative for joint pain and neck pain.  Skin: Negative for rash.  Neurological: Positive for headaches.       Objective:   Physical Exam     Assessment:          Plan:     Meds ordered this encounter  Medications  . amoxicillin (AMOXIL) 875 MG tablet    Sig: Take 1 tablet (875 mg total) by mouth 2 (two) times daily.    Dispense:  20 tablet    Refill:  0    She has had allergy to Keflex reports she has taken Amoxcillin without any difficulty.    Advised patient call the office or your primary care doctor for an appointment if no  improvement within 72 hours or if any symptoms change or worsen at any time  Advised ER or urgent Care if after hours or on weekend. Call 911 for emergency symptoms at any time.Patinet verbalized understanding of all instructions given/reviewed and treatment plan and has no further questions or concerns at this time.    Patient verbalized understanding of all instructions given and denies any further questions at this time.

## 2018-08-16 NOTE — Patient Instructions (Signed)
Otitis Media, Adult  Otitis media means that the middle ear is red and swollen (inflamed) and full of fluid. The condition usually goes away on its own. Follow these instructions at home:  Take over-the-counter and prescription medicines only as told by your doctor.  If you were prescribed an antibiotic medicine, take it as told by your doctor. Do not stop taking the antibiotic even if you start to feel better.  Keep all follow-up visits as told by your doctor. This is important. Contact a doctor if:  You have bleeding from your nose.  There is a lump on your neck.  You are not getting better in 5 days.  You feel worse instead of better. Get help right away if:  You have pain that is not helped with medicine.  You have swelling, redness, or pain around your ear.  You get a stiff neck.  You cannot move part of your face (paralyzed).  You notice that the bone behind your ear hurts when you touch it.  You get a very bad headache. Summary  Otitis media means that the middle ear is red, swollen, and full of fluid.  This condition usually goes away on its own. In some cases, treatment may be needed.  If you were prescribed an antibiotic medicine, take it as told by your doctor. This information is not intended to replace advice given to you by your health care provider. Make sure you discuss any questions you have with your health care provider. Document Released: 12/29/2007 Document Revised: 08/02/2016 Document Reviewed: 08/02/2016 Elsevier Interactive Patient Education  2019 [employer] Amoxicillin capsules or tablets What is this medicine? AMOXICILLIN (a mox i SIL in) is a penicillin antibiotic. It is used to treat certain kinds of bacterial infections. It will not work for colds, flu, or other viral infections. This medicine may be used for other purposes; ask your health care provider or pharmacist if you have questions. COMMON BRAND NAME(S): Amoxil, Moxilin, Sumox,  Trimox What should I tell my health care provider before I take this medicine? They need to know if you have any of these conditions: -asthma -kidney disease -an unusual or allergic reaction to amoxicillin, other penicillins, cephalosporin antibiotics, other medicines, foods, dyes, or preservatives -pregnant or trying to get pregnant -breast-feeding How should I use this medicine? Take this medicine by mouth with a glass of water. Follow the directions on your prescription label. You may take this medicine with food or on an empty stomach. Take your medicine at regular intervals. Do not take your medicine more often than directed. Take all of your medicine as directed even if you think your are better. Do not skip doses or stop your medicine early. Talk to your pediatrician regarding the use of this medicine in children. While this drug may be prescribed for selected conditions, precautions do apply. Overdosage: If you think you have taken too much of this medicine contact a poison control center or emergency room at once. NOTE: This medicine is only for you. Do not share this medicine with others. What if I miss a dose? If you miss a dose, take it as soon as you can. If it is almost time for your next dose, take only that dose. Do not take double or extra doses. What may interact with this medicine? -amiloride -birth control pills -chloramphenicol -macrolides -probenecid -sulfonamides -tetracyclines This list may not describe all possible interactions. Give your health care provider a list of all the medicines, herbs, non-prescription drugs,  or dietary supplements you use. Also tell them if you smoke, drink alcohol, or use illegal drugs. Some items may interact with your medicine. What should I watch for while using this medicine? Tell your doctor or health care professional if your symptoms do not improve in 2 or 3 days. Take all of the doses of your medicine as directed. Do not skip doses  or stop your medicine early. If you are diabetic, you may get a false positive result for sugar in your urine with certain brands of urine tests. Check with your doctor. Do not treat diarrhea with over-the-counter products. Contact your doctor if you have diarrhea that lasts more than 2 days or if the diarrhea is severe and watery. What side effects may I notice from receiving this medicine? Side effects that you should report to your doctor or health care professional as soon as possible: -allergic reactions like skin rash, itching or hives, swelling of the face, lips, or tongue -breathing problems -dark urine -redness, blistering, peeling or loosening of the skin, including inside the mouth -seizures -severe or watery diarrhea -trouble passing urine or change in the amount of urine -unusual bleeding or bruising -unusually weak or tired -yellowing of the eyes or skin Side effects that usually do not require medical attention (report to your doctor or health care professional if they continue or are bothersome): -dizziness -headache -stomach upset -trouble sleeping This list may not describe all possible side effects. Call your doctor for medical advice about side effects. You may report side effects to FDA at 1-800-FDA-1088. Where should I keep my medicine? Keep out of the reach of children. Store between 68 and 77 degrees F (20 and 25 degrees C). Keep bottle closed tightly. Throw away any unused medicine after the expiration date. NOTE: This sheet is a summary. It may not cover all possible information. If you have questions about this medicine, talk to your doctor, pharmacist, or health care provider.  2019 Elsevier/Gold Standard (2007-10-03 14:10:59) Upper Respiratory Infection, Adult An upper respiratory infection (URI) affects the nose, throat, and upper air passages. URIs are caused by germs (viruses). The most common type of URI is often called "the common cold." Medicines cannot  cure URIs, but you can do things at home to relieve your symptoms. URIs usually get better within 7-10 days. Follow these instructions at home: Activity  Rest as needed.  If you have a fever, stay home from work or school until your fever is gone, or until your doctor says you may return to work or school. ? You should stay home until you cannot spread the infection anymore (you are not contagious). ? Your doctor may have you wear a face mask so you have less risk of spreading the infection. Relieving symptoms  Gargle with a salt-water mixture 3-4 times a day or as needed. To make a salt-water mixture, completely dissolve -1 tsp of salt in 1 cup of warm water.  Use a cool-mist humidifier to add moisture to the air. This can help you breathe more easily. Eating and drinking   Drink enough fluid to keep your pee (urine) pale yellow.  Eat soups and other clear broths. General instructions   Take over-the-counter and prescription medicines only as told by your doctor. These include cold medicines, fever reducers, and cough suppressants.  Do not use any products that contain nicotine or tobacco. These include cigarettes and e-cigarettes. If you need help quitting, ask your doctor.  Avoid being where people are smoking (  avoid secondhand smoke).  Make sure you get regular shots and get the flu shot every year.  Keep all follow-up visits as told by your doctor. This is important. How to avoid spreading infection to others   Wash your hands often with soap and water. If you do not have soap and water, use hand sanitizer.  Avoid touching your mouth, face, eyes, or nose.  Cough or sneeze into a tissue or your sleeve or elbow. Do not cough or sneeze into your hand or into the air. Contact a doctor if:  You are getting worse, not better.  You have any of these: ? A fever. ? Chills. ? Brown or red mucus in your nose. ? Yellow or brown fluid (discharge)coming from your nose. ? Pain  in your face, especially when you bend forward. ? Swollen neck glands. ? Pain with swallowing. ? White areas in the back of your throat. Get help right away if:  You have shortness of breath that gets worse.  You have very bad or constant: ? Headache. ? Ear pain. ? Pain in your forehead, behind your eyes, and over your cheekbones (sinus pain). ? Chest pain.  You have long-lasting (chronic) lung disease along with any of these: ? Wheezing. ? Long-lasting cough. ? Coughing up blood. ? A change in your usual mucus.  You have a stiff neck.  You have changes in your: ? Vision. ? Hearing. ? Thinking. ? Mood. Summary  An upper respiratory infection (URI) is caused by a germ called a virus. The most common type of URI is often called "the common cold."  URIs usually get better within 7-10 days.  Take over-the-counter and prescription medicines only as told by your doctor. This information is not intended to replace advice given to you by your health care provider. Make sure you discuss any questions you have with your health care provider. Document Released: 12/29/2007 Document Revised: 03/04/2017 Document Reviewed: 03/04/2017 Elsevier Interactive Patient Education  2019 [employer].

## 2018-10-06 ENCOUNTER — Encounter: Payer: Self-pay | Admitting: Adult Health

## 2018-10-06 ENCOUNTER — Other Ambulatory Visit: Payer: Self-pay

## 2018-10-06 ENCOUNTER — Ambulatory Visit: Payer: Managed Care, Other (non HMO) | Admitting: Adult Health

## 2018-10-06 VITALS — BP 118/72 | HR 86 | Temp 99.3°F | Resp 16 | Ht 65.0 in | Wt 184.0 lb

## 2018-10-06 VITALS — HR 86 | Temp 99.3°F | Resp 14

## 2018-10-06 DIAGNOSIS — Z0189 Encounter for other specified special examinations: Secondary | ICD-10-CM | POA: Diagnosis not present

## 2018-10-06 DIAGNOSIS — Z008 Encounter for other general examination: Secondary | ICD-10-CM

## 2018-10-06 DIAGNOSIS — J329 Chronic sinusitis, unspecified: Secondary | ICD-10-CM | POA: Diagnosis not present

## 2018-10-06 MED ORDER — LEVOFLOXACIN 500 MG PO TABS: 500.0000 mg | ORAL_TABLET | Freq: Every day | ORAL | 0 refills | Status: DC

## 2018-10-06 MED ORDER — PREDNISONE 10 MG (21) PO TBPK: ORAL_TABLET | ORAL | 0 refills | Status: DC

## 2018-10-06 NOTE — Patient Instructions (Signed)
Please see your primary care provider for a yearly physical and labs.  I will have the office call you on your glucose and cholesterol results when they return if you have not heard within 1 week please call the office and will have you follow up with your primary care doctor for any abnormal's. This biometric physical is not a substitute for seeing a primary care provider for a physical. Provider also recommends if you do not have a primary care provider for patient see a  primary care physician/ provider  for a routine physical and to establish primary care. Patient may chose provider of choice. Also gave the [place] at [phone] or web site at [email or web address] to help assist with finding a primary care doctor. Patient understands this office is acute care and no primary care is in this office.   Follow up with primary care as needed for chronic and maintenance health care- can be seen in this employee clinic for acute care.   Health Maintenance, Female Adopting a healthy lifestyle and getting preventive care can go a long way to promote health and wellness. Talk with your health care provider about what schedule of regular examinations is right for you. This is a good chance for you to check in with your provider about disease prevention and staying healthy. In between checkups, there are plenty of things you can do on your own. Experts have done a lot of research about which lifestyle changes and preventive measures are most likely to keep you healthy. Ask your health care provider for more information. Weight and diet Eat a healthy diet  Be sure to include plenty of vegetables, fruits, low-fat dairy products, and lean protein.  Do not eat a lot of foods high in solid fats, added sugars, or salt.  Get regular exercise. This is one of the most important things you can do for your health. ? Most adults should exercise for at least 150 minutes each week.  The exercise should increase your heart rate and make you sweat (moderate-intensity exercise). ? Most adults should also do strengthening exercises at least twice a week. This is in addition to the moderate-intensity exercise. Maintain a healthy weight  Body mass index (BMI) is a measurement that can be used to identify possible weight problems. It estimates body fat based on height and weight. Your health care provider can help determine your BMI and help you achieve or maintain a healthy weight.  For females 64 years of age and older: ? A BMI below 18.5 is considered underweight. ? A BMI of 18.5 to 24.9 is normal. ? A BMI of 25 to 29.9 is considered overweight. ? A BMI of 30 and above is considered obese. Watch levels of cholesterol and blood lipids  You should start having your blood tested for lipids and cholesterol at 51 years of age, then have this test every 5 years.  You may need to have your cholesterol levels checked more often if: ? Your lipid or cholesterol levels are high. ? You are older than 51 years of age. ? You are at high risk for heart disease. Cancer screening Lung Cancer  Lung cancer screening is recommended for adults 22-28 years old who are at high risk for lung cancer because of a history of smoking.  A yearly low-dose CT scan of the lungs is recommended for people who: ? Currently smoke. ? Have quit within the past 15 years. ? Have  at least a 30-pack-year history of smoking. A pack year is smoking an average of one pack of cigarettes a day for 1 year.  Yearly screening should continue until it has been 15 years since you quit.  Yearly screening should stop if you develop a health problem that would prevent you from having lung cancer treatment. Breast Cancer  Practice breast self-awareness. This means understanding how your breasts normally appear and feel.  It also means doing regular breast self-exams. Let your health care provider know about any  changes, no matter how small.  If you are in your 20s or 30s, you should have a clinical breast exam (CBE) by a health care provider every 1-3 years as part of a regular health exam.  If you are 46 or older, have a CBE every year. Also consider having a breast X-ray (mammogram) every year.  If you have a family history of breast cancer, talk to your health care provider about genetic screening.  If you are at high risk for breast cancer, talk to your health care provider about having an MRI and a mammogram every year.  Breast cancer gene (BRCA) assessment is recommended for women who have family members with BRCA-related cancers. BRCA-related cancers include: ? Breast. ? Ovarian. ? Tubal. ? Peritoneal cancers.  Results of the assessment will determine the need for genetic counseling and BRCA1 and BRCA2 testing. Cervical Cancer Your health care provider may recommend that you be screened regularly for cancer of the pelvic organs (ovaries, uterus, and vagina). This screening involves a pelvic examination, including checking for microscopic changes to the surface of your cervix (Pap test). You may be encouraged to have this screening done every 3 years, beginning at age 4.  For women ages 71-65, health care providers may recommend pelvic exams and Pap testing every 3 years, or they may recommend the Pap and pelvic exam, combined with testing for human papilloma virus (HPV), every 5 years. Some types of HPV increase your risk of cervical cancer. Testing for HPV may also be done on women of any age with unclear Pap test results.  Other health care providers may not recommend any screening for nonpregnant women who are considered low risk for pelvic cancer and who do not have symptoms. Ask your health care provider if a screening pelvic exam is right for you.  If you have had past treatment for cervical cancer or a condition that could lead to cancer, you need Pap tests and screening for cancer  for at least 20 years after your treatment. If Pap tests have been discontinued, your risk factors (such as having a new sexual partner) need to be reassessed to determine if screening should resume. Some women have medical problems that increase the chance of getting cervical cancer. In these cases, your health care provider may recommend more frequent screening and Pap tests. Colorectal Cancer  This type of cancer can be detected and often prevented.  Routine colorectal cancer screening usually begins at 51 years of age and continues through 50 years of age.  Your health care provider may recommend screening at an earlier age if you have risk factors for colon cancer.  Your health care provider may also recommend using home test kits to check for hidden blood in the stool.  A small camera at the end of a tube can be used to examine your colon directly (sigmoidoscopy or colonoscopy). This is done to check for the earliest forms of colorectal cancer.  Routine screening  usually begins at age 75.  Direct examination of the colon should be repeated every 5-10 years through 51 years of age. However, you may need to be screened more often if early forms of precancerous polyps or small growths are found. Skin Cancer  Check your skin from head to toe regularly.  Tell your health care provider about any new moles or changes in moles, especially if there is a change in a mole's shape or color.  Also tell your health care provider if you have a mole that is larger than the size of a pencil eraser.  Always use sunscreen. Apply sunscreen liberally and repeatedly throughout the day.  Protect yourself by wearing long sleeves, pants, a wide-brimmed hat, and sunglasses whenever you are outside. Heart disease, diabetes, and high blood pressure  High blood pressure causes heart disease and increases the risk of stroke. High blood pressure is more likely to develop in: ? People who have blood pressure in  the high end of the normal range (130-139/85-89 mm Hg). ? People who are overweight or obese. ? People who are African American.  If you are 42-63 years of age, have your blood pressure checked every 3-5 years. If you are 60 years of age or older, have your blood pressure checked every year. You should have your blood pressure measured twice--once when you are at a hospital or clinic, and once when you are not at a hospital or clinic. Record the average of the two measurements. To check your blood pressure when you are not at a hospital or clinic, you can use: ? An automated blood pressure machine at a pharmacy. ? A home blood pressure monitor.  If you are between 90 years and 57 years old, ask your health care provider if you should take aspirin to prevent strokes.  Have regular diabetes screenings. This involves taking a blood sample to check your fasting blood sugar level. ? If you are at a normal weight and have a low risk for diabetes, have this test once every three years after 51 years of age. ? If you are overweight and have a high risk for diabetes, consider being tested at a younger age or more often. Preventing infection Hepatitis B  If you have a higher risk for hepatitis B, you should be screened for this virus. You are considered at high risk for hepatitis B if: ? You were born in a country where hepatitis B is common. Ask your health care provider which countries are considered high risk. ? Your parents were born in a high-risk country, and you have not been immunized against hepatitis B (hepatitis B vaccine). ? You have HIV or AIDS. ? You use needles to inject street drugs. ? You live with someone who has hepatitis B. ? You have had sex with someone who has hepatitis B. ? You get hemodialysis treatment. ? You take certain medicines for conditions, including cancer, organ transplantation, and autoimmune conditions. Hepatitis C  Blood testing is recommended for: ? Everyone  born from [birth year] through 1963. ? Anyone with known risk factors for hepatitis C. Sexually transmitted infections (STIs)  You should be screened for sexually transmitted infections (STIs) including gonorrhea and chlamydia if: ? You are sexually active and are younger than 51 years of age. ? You are older than 51 years of age and your health care provider tells you that you are at risk for this type of infection. ? Your sexual activity has changed since you were last  screened and you are at an increased risk for chlamydia or gonorrhea. Ask your health care provider if you are at risk.  If you do not have HIV, but are at risk, it may be recommended that you take a prescription medicine daily to prevent HIV infection. This is called pre-exposure prophylaxis (PrEP). You are considered at risk if: ? You are sexually active and do not regularly use condoms or know the HIV status of your partner(s). ? You take drugs by injection. ? You are sexually active with a partner who has HIV. Talk with your health care provider about whether you are at high risk of being infected with HIV. If you choose to begin PrEP, you should first be tested for HIV. You should then be tested every 3 months for as long as you are taking PrEP. Pregnancy  If you are premenopausal and you may become pregnant, ask your health care provider about preconception counseling.  If you may become pregnant, take 400 to 800 micrograms (mcg) of folic acid every day.  If you want to prevent pregnancy, talk to your health care provider about birth control (contraception). Osteoporosis and menopause  Osteoporosis is a disease in which the bones lose minerals and strength with aging. This can result in serious bone fractures. Your risk for osteoporosis can be identified using a bone density scan.  If you are 21 years of age or older, or if you are at risk for osteoporosis and fractures, ask your health care provider if you should be  screened.  Ask your health care provider whether you should take a calcium or vitamin D supplement to lower your risk for osteoporosis.  Menopause may have certain physical symptoms and risks.  Hormone replacement therapy may reduce some of these symptoms and risks. Talk to your health care provider about whether hormone replacement therapy is right for you. Follow these instructions at home:  Schedule regular health, dental, and eye exams.  Stay current with your immunizations.  Do not use any tobacco products including cigarettes, chewing tobacco, or electronic cigarettes.  If you are pregnant, do not drink alcohol.  If you are breastfeeding, limit how much and how often you drink alcohol.  Limit alcohol intake to no more than 1 drink per day for nonpregnant women. One drink equals 12 ounces of beer, 5 ounces of wine, or 1 ounces of hard liquor.  Do not use street drugs.  Do not share needles.  Ask your health care provider for help if you need support or information about quitting drugs.  Tell your health care provider if you often feel depressed.  Tell your health care provider if you have ever been abused or do not feel safe at home. This information is not intended to replace advice given to you by your health care provider. Make sure you discuss any questions you have with your health care provider. Document Released: 01/25/2011 Document Revised: 12/18/2015 Document Reviewed: 04/15/2015 Elsevier Interactive Patient Education  2019 [employer]. Sinusitis, Adult Sinusitis is soreness and swelling (inflammation) of your sinuses. Sinuses are hollow spaces in the bones around your face. They are located:  Around your eyes.  In the middle of your forehead.  Behind your nose.  In your cheekbones. Your sinuses and nasal passages are lined with a fluid called mucus. Mucus drains out of your sinuses. Swelling can trap mucus in your sinuses. This lets germs (bacteria,  virus, or fungus) grow, which leads to infection. Most of the time, this  condition is caused by a virus. What are the causes? This condition is caused by:  Allergies.  Asthma.  Germs.  Things that block your nose or sinuses.  Growths in the nose (nasal polyps).  Chemicals or irritants in the air.  Fungus (rare). What increases the risk? You are more likely to develop this condition if:  You have a weak body defense system (immune system).  You do a lot of swimming or diving.  You use nasal sprays too much.  You smoke. What are the signs or symptoms? The main symptoms of this condition are pain and a feeling of pressure around the sinuses. Other symptoms include:  Stuffy nose (congestion).  Runny nose (drainage).  Swelling and warmth in the sinuses.  Headache.  Toothache.  A cough that may get worse at night.  Mucus that collects in the throat or the back of the nose (postnasal drip).  Being unable to smell and taste.  Being very tired (fatigue).  A fever.  Sore throat.  Bad breath. How is this diagnosed? This condition is diagnosed based on:  Your symptoms.  Your medical history.  A physical exam.  Tests to find out if your condition is short-term (acute) or long-term (chronic). Your doctor may: ? Check your nose for growths (polyps). ? Check your sinuses using a tool that has a light (endoscope). ? Check for allergies or germs. ? Do imaging tests, such as an MRI or CT scan. How is this treated? Treatment for this condition depends on the cause and whether it is short-term or long-term.  If caused by a virus, your symptoms should go away on their own within 10 days. You may be given medicines to relieve symptoms. They include: ? Medicines that shrink swollen tissue in the nose. ? Medicines that treat allergies (antihistamines). ? A spray that treats swelling of the nostrils. ? Rinses that help get rid of thick mucus in your nose (nasal saline  washes).  If caused by bacteria, your doctor may wait to see if you will get better without treatment. You may be given antibiotic medicine if you have: ? A very bad infection. ? A weak body defense system.  If caused by growths in the nose, you may need to have surgery. Follow these instructions at home: Medicines  Take, use, or apply over-the-counter and prescription medicines only as told by your doctor. These may include nasal sprays.  If you were prescribed an antibiotic medicine, take it as told by your doctor. Do not stop taking the antibiotic even if you start to feel better. Hydrate and humidify   Drink enough water to keep your pee (urine) pale yellow.  Use a cool mist humidifier to keep the humidity level in your home above 50%.  Breathe in steam for 10-15 minutes, 3-4 times a day, or as told by your doctor. You can do this in the bathroom while a hot shower is running.  Try not to spend time in cool or dry air. Rest  Rest as much as you can.  Sleep with your head raised (elevated).  Make sure you get enough sleep each night. General instructions   Put a warm, moist washcloth on your face 3-4 times a day, or as often as told by your doctor. This will help with discomfort.  Wash your hands often with soap and water. If there is no soap and water, use hand sanitizer.  Do not smoke. Avoid being around people who are smoking (secondhand smoke).  Keep all follow-up visits as told by your doctor. This is important. Contact a doctor if:  You have a fever.  Your symptoms get worse.  Your symptoms do not get better within 10 days. Get help right away if:  You have a very bad headache.  You cannot stop throwing up (vomiting).  You have very bad pain or swelling around your face or eyes.  You have trouble seeing.  You feel confused.  Your neck is stiff.  You have trouble breathing. Summary  Sinusitis is swelling of your sinuses. Sinuses are hollow  spaces in the bones around your face.  This condition is caused by tissues in your nose that become inflamed or swollen. This traps germs. These can lead to infection.  If you were prescribed an antibiotic medicine, take it as told by your doctor. Do not stop taking it even if you start to feel better.  Keep all follow-up visits as told by your doctor. This is important. This information is not intended to replace advice given to you by your health care provider. Make sure you discuss any questions you have with your health care provider. Document Released: 12/29/2007 Document Revised: 12/12/2017 Document Reviewed: 12/12/2017 Elsevier Interactive Patient Education  2019 [employer] Prednisolone tablets What is this medicine? PREDNISOLONE (pred NISS oh lone) is a corticosteroid. It is commonly used to treat inflammation of the skin, joints, lungs, and other organs. Common conditions treated include asthma, allergies, and arthritis. It is also used for other conditions, such as blood disorders and diseases of the adrenal glands. This medicine may be used for other purposes; ask your health care provider or pharmacist if you have questions. COMMON BRAND NAME(S): Millipred, Millipred DP, Millipred DP 12-Day, Millipred DP 6 Day, Prednoral What should I tell my health care provider before I take this medicine? They need to know if you have any of these conditions: -Cushing's syndrome -diabetes -glaucoma -heart problems or disease -high blood pressure -infection such as herpes, measles, tuberculosis, or chickenpox -kidney disease -liver disease -mental problems -myasthenia gravis -osteoporosis -seizures -stomach ulcer or intestine disease including colitis and diverticulitis -thyroid problem -an unusual or allergic reaction to lactose, prednisolone, other medicines, foods, dyes, or preservatives -pregnant or trying to get pregnant -breast-feeding How should I use this medicine? Take  this medicine by mouth with a glass of water. Follow the directions on the prescription label. Take it with food or milk to avoid stomach upset. If you are taking this medicine once a day, take it in the morning. Do not take more medicine than you are told to take. Do not suddenly stop taking your medicine because you may develop a severe reaction. Your doctor will tell you how much medicine to take. If your doctor wants you to stop the medicine, the dose may be slowly lowered over time to avoid any side effects. Talk to your pediatrician regarding the use of this medicine in children. Special care may be needed. Overdosage: If you think you have taken too much of this medicine contact a poison control center or emergency room at once. NOTE: This medicine is only for you. Do not share this medicine with others. What if I miss a dose? If you miss a dose, take it as soon as you can. If it is almost time for your next dose, take only that dose. Do not take double or extra doses. What may interact with this medicine? Do not take this medicine with any of the following medications: -  metyrapone -mifepristone This medicine may also interact with the following medications: -aminoglutethimide -amphotericin B -aspirin and aspirin-like medicines -barbiturates -certain medicines for diabetes, like glipizide or glyburide -cholestyramine -cholinesterase inhibitors -cyclosporine -digoxin -diuretics -ephedrine -female hormones, like estrogens and birth control pills -isoniazid -ketoconazole -NSAIDS, medicines for pain and inflammation, like ibuprofen or naproxen -phenytoin -rifampin -toxoids -vaccines -warfarin This list may not describe all possible interactions. Give your health care provider a list of all the medicines, herbs, non-prescription drugs, or dietary supplements you use. Also tell them if you smoke, drink alcohol, or use illegal drugs. Some items may interact with your medicine. What  should I watch for while using this medicine? Visit your doctor or health care professional for regular checks on your progress. If you are taking this medicine over a prolonged period, carry an identification card with your name and address, the type and dose of your medicine, and your doctor's name and address. This medicine may increase your risk of getting an infection. Tell your doctor or health care professional if you are around anyone with measles or chickenpox, or if you develop sores or blisters that do not heal properly. If you are going to have surgery, tell your doctor or health care professional that you have taken this medicine within the last twelve months. Ask your doctor or health care professional about your diet. You may need to lower the amount of salt you eat. This medicine may affect blood sugar levels. If you have diabetes, check with your doctor or health care professional before you change your diet or the dose of your diabetic medicine. What side effects may I notice from receiving this medicine? Side effects that you should report to your doctor or health care professional as soon as possible: -allergic reactions like skin rash, itching or hives, swelling of the face, lips, or tongue -changes in emotions or moods -changes in vision -eye pain -signs and symptoms of high blood sugar such as dizziness; dry mouth; dry skin; fruity breath; nausea; stomach pain; increased hunger or thirst; increased urination -signs and symptoms of infection like fever or chills; cough; sore throat; pain or trouble passing urine -slow growth in children (if used for longer periods of time) -swelling of ankles, feet -trouble sleeping -unusually weak or tired -weak bones (if used for longer periods of time) Side effects that usually do not require medical attention (report to your doctor or health care professional if they continue or are bothersome): -increased hunger -nausea -skin  problems, acne, thin and shiny skin -upset stomach -weight gain This list may not describe all possible side effects. Call your doctor for medical advice about side effects. You may report side effects to FDA at 1-800-FDA-1088. Where should I keep my medicine? Keep out of the reach of children. Store at room temperature between 15 and 30 degrees C (59 and 86 degrees F). Keep container tightly closed. Throw away any unused medicine after the expiration date. NOTE: This sheet is a summary. It may not cover all possible information. If you have questions about this medicine, talk to your doctor, pharmacist, or health care provider.  2019 Elsevier/[employer] Standard (2015-08-14 12:30:30) Levofloxacin tablets What is this medicine? LEVOFLOXACIN (lee voe FLOX a sin) is a quinolone antibiotic. It is used to treat certain kinds of bacterial infections. It will not work for colds, flu, or other viral infections. This medicine may be used for other purposes; ask your health care provider or pharmacist if you have questions. COMMON BRAND NAME(S): Levaquin,  Levaquin Leva-Pak What should I tell my health care provider before I take this medicine? They need to know if you have any of these conditions: -bone problems -diabetes -heart disease -high blood pressure -history of irregular heartbeat -history of low levels of potassium in the blood -joint problems -kidney disease -liver disease -mental illness -myasthenia gravis -seizures -tendon problems -tingling of the fingers or toes, or other nerve disorder -an unusual or allergic reaction to levofloxacin, other quinolone antibiotics, foods, dyes, or preservatives -pregnant or trying to get pregnant -breast-feeding How should I use this medicine? Take this medicine by mouth with a full glass of water. Follow the directions on the prescription label. You can take it with or without food. If it upsets your stomach, take it with food. Take your medicine at  regular intervals. Do not take your medicine more often than directed. Take all of your medicine as directed even if you think you are better. Do not skip doses or stop your medicine early. Avoid antacids, calcium, iron, and zinc products for 2 hours before and 2 hours after taking a dose of this medicine. A special MedGuide will be given to you by the pharmacist with each prescription and refill. Be sure to read this information carefully each time. Talk to your pediatrician regarding the use of this medicine in children. While this drug may be prescribed for children as young as 6 months for selected conditions, precautions do apply. Overdosage: If you think you have taken too much of this medicine contact a poison control center or emergency room at once. NOTE: This medicine is only for you. Do not share this medicine with others. What if I miss a dose? If you miss a dose, take it as soon as you remember. If it is almost time for your next dose, take only that dose. Do not take double or extra doses. What may interact with this medicine? Do not take this medicine with any of the following medications: -cisapride -dofetilide -dronedarone -pimozide -thioridazine -ziprasidone This medicine may also interact with the following medications: -antacids -birth control pills -certain medicines for diabetes, like glipizide, glyburide, or insulin -certain medicines that treat or prevent blood clots like warfarin -didanosine buffered tablets or powder -multivitamins -NSAIDS, medicines for pain and inflammation, like ibuprofen or naproxen -other medicines that prolong the QT interval (cause an abnormal heart rhythm) -steroid medicines like prednisone or cortisone -sucralfate -theophylline This list may not describe all possible interactions. Give your health care provider a list of all the medicines, herbs, non-prescription drugs, or dietary supplements you use. Also tell them if you smoke, drink  alcohol, or use illegal drugs. Some items may interact with your medicine. What should I watch for while using this medicine? Tell your doctor or healthcare professional if your symptoms do not start to get better or if they get worse. Do not treat diarrhea with over the counter products. Contact your doctor if you have diarrhea that lasts more than 2 days or if it is severe and watery. Check with your doctor or health care professional if you get an attack of severe diarrhea, nausea and vomiting, or if you sweat a lot. The loss of too much body fluid can make it dangerous for you to take this medicine. This medicine may affect blood sugar levels. If you have diabetes, check with your doctor or health care professional before you change your diet or the dose of your diabetic medicine. You may get drowsy or dizzy. Do not drive,  use machinery, or do anything that needs mental alertness until you know how this medicine affects you. Do not sit or stand up quickly, especially if you are an older patient. This reduces the risk of dizzy or fainting spells. This medicine can make you more sensitive to the sun. Keep out of the sun. If you cannot avoid being in the sun, wear protective clothing and use a sunscreen. Do not use sun lamps or tanning beds/booths. What side effects may I notice from receiving this medicine? Side effects that you should report to your doctor or health care professional as soon as possible: -allergic reactions like skin rash or hives, swelling of the face, lips, or tongue -anxious -bloody or watery diarrhea -breathing problems -confusion -depressed mood -fast, irregular heartbeat -fever -hallucination, loss of contact with reality -joint, muscle, or tendon pain or swelling -loss of memory -muscle weakness -pain, tingling, numbness in the hands or feet -seizures -signs and symptoms of aortic dissection such as sudden chest, stomach, or back pain -signs and symptoms of high  blood sugar such as dizziness; dry mouth; dry skin; fruity breath; nausea; stomach pain; increased hunger or thirst; increased urination -signs and symptoms of liver injury like dark yellow or brown urine; general ill feeling or flu-like symptoms; light-colored stools; loss of appetite; nausea; right upper belly pain; unusually weak or tired; yellowing of the eyes or skin -signs and symptoms of low blood sugar such as feeling anxious; confusion; dizziness; increased hunger; unusually weak or tired; sweating; shakiness; cold; irritable; headache; blurred vision; fast heartbeat; loss of consciousness; pale skin -suicidal thoughts or other mood changes -sunburn -unusually weak or tired Side effects that usually do not require medical attention (report to your doctor or health care professional if they continue or are bothersome): -constipation -dry mouth -headache -nausea, vomiting -trouble sleeping This list may not describe all possible side effects. Call your doctor for medical advice about side effects. You may report side effects to FDA at 1-800-FDA-1088. Where should I keep my medicine? Keep out of the reach of children. Store at room temperature between 15 and 30 degrees C (59 and 86 degrees F). Keep in a tightly closed container. Throw away any unused medicine after the expiration date. NOTE: This sheet is a summary. It may not cover all possible information. If you have questions about this medicine, talk to your doctor, pharmacist, or health care provider.  2019 Elsevier/Gold Standard (2018-01-24 17:41:46)

## 2018-10-06 NOTE — Progress Notes (Signed)
[facility]  Subjective:     Patient ID: Isabel Hines, female   DOB: 07/29/1965, 53 y.o.   MRN: [id number]  HPI Today's Vitals   10/06/18 0839  BP: 118/72  Pulse: 86  Resp: 16  Temp: 99.3 F (37.4 C)  TempSrc: Oral  SpO2: 97%  Weight: 184 lb (83.5 kg)  Height: 5\' 5"  (1.651 m)   Body mass index is 30.62 kg/m.   Patient is a 52 year female  in no acute distress who comes to the clinic for a biometric screening with brief biometric physical with labs include fasting glucose and lipids.  [name], MD is her primary care sees regularly. She has a gynecologist sees yearly.   Going to [employer] trying to work out and eat healthy- she is planning on getting married in the spring. She reports she is happy and excited.   Smoker - trying to quit - stopped for the past week and feels she is doing well.   She has had recurrent sinusitis see sick visit note from today's office visit. She has sinus pressure, bilateral ear pain. She is using Zyrtec, Azestaline nasal spray, a;;etgy eye drops and was treated by her primary care provider with Amoxicillin and Prednisone- she reports no improvement since 09/20/18.0 She reports her symptoms started again after being seen for otitis media on 1/22/520 and taking Amoxicillin at that time. She reports she improved completely and symptoms restarted on around 09/04/18.  Patient  denies any fever, body aches,chills, rash, chest pain, shortness of breath, nausea, vomiting, or diarrhea.    Allergies  Allergen Reactions  . Keflex [Cephalexin] Hives  . Sulfa Antibiotics Hives and Itching  . Sulfasalazine Hives  . Hydrocodone Itching  . Tramadol Hives   Patient Active Problem List   Diagnosis Date Noted  . Other specified diseases of intestine   . Left lower quadrant pain   . Insomnia 06/25/2015  . Depression 04/02/2015  . Abnormal cervical Pap smear with positive HPV DNA test 06/10/2014  .  Anxiety   . GERD (gastroesophageal reflux disease)      Review of Systems  Constitutional: Positive for fatigue. Negative for activity change, appetite change, chills, diaphoresis, fever and unexpected weight change.  HENT: Positive for congestion, postnasal drip, rhinorrhea, sinus pressure, sinus pain and sore throat. Negative for dental problem, drooling, ear discharge, ear pain, facial swelling, hearing loss, mouth sores, nosebleeds, sneezing, tinnitus, trouble swallowing and voice change.   Eyes: Positive for discharge (bilateral watery eyes ) and itching. Negative for photophobia, pain, redness and visual disturbance.  Respiratory: Negative.  Negative for apnea, cough, choking, chest tightness, shortness of breath, wheezing and stridor.   Cardiovascular: Negative.  Negative for chest pain, palpitations and leg swelling.  Gastrointestinal: Negative.   Endocrine: Negative.   Genitourinary: Negative.   Musculoskeletal: Negative.   Skin: Negative.   Allergic/Immunologic: Positive for environmental allergies. Negative for food allergies and immunocompromised state.        -- Keflex (Cephalexin) -- Hives  -- Sulfa Antibiotics -- Hives and Itching  -- Sulfasalazine -- Hives  -- Hydrocodone -- Itching  -- Tramadol -- Hives   Neurological: Positive for headaches. Negative for dizziness, tremors, seizures, syncope, facial asymmetry, speech difficulty, weakness, light-headedness and numbness.  Hematological: Negative.   Psychiatric/Behavioral: Negative.        Objective:   Physical Exam Vitals signs reviewed.  Constitutional:      General: She is not in acute  distress.    Appearance: Normal appearance. She is well-developed. She is not ill-appearing, toxic-appearing or diaphoretic.  HENT:     Head: Normocephalic and atraumatic.     Jaw: There is normal jaw occlusion.     Salivary Glands: Right salivary gland is not diffusely enlarged or tender. Left salivary gland is not diffusely  enlarged or tender.     Right Ear: Hearing, ear canal and external ear normal. No drainage, swelling or tenderness. A middle ear effusion is present. There is no impacted cerumen. Tympanic membrane is erythematous. Tympanic membrane is not perforated, retracted or bulging.     Left Ear: Hearing, ear canal and external ear normal. No drainage, swelling or tenderness. A middle ear effusion is present. There is no impacted cerumen. Tympanic membrane is erythematous. Tympanic membrane is not perforated, retracted or bulging.     Nose: Mucosal edema, congestion and rhinorrhea present.     Right Sinus: Maxillary sinus tenderness and frontal sinus tenderness present.     Left Sinus: Maxillary sinus tenderness and frontal sinus tenderness present.     Mouth/Throat:     Lips: Pink.     Mouth: Mucous membranes are moist.     Pharynx: Oropharynx is clear. Uvula midline. Posterior oropharyngeal erythema present. No pharyngeal swelling or oropharyngeal exudate.     Tonsils: No tonsillar exudate or tonsillar abscesses. Swelling: 1+ on the right. 1+ on the left.     Comments:  Cobblestoning posterior pharynx; bilateral allergic shiners; bilateral TMs air fluid level clear; bilateral nasal turbinates moderate edema erythema; yellow brown purulent  discharge;    Eyes:     General: Lids are normal. No scleral icterus.       Right eye: No discharge.        Left eye: No discharge.     Conjunctiva/sclera: Conjunctivae normal.     Pupils: Pupils are equal, round, and reactive to light.  Neck:     Musculoskeletal: Full passive range of motion without pain, normal range of motion and neck supple. Normal range of motion. No edema or muscular tenderness.     Thyroid: No thyromegaly.     Vascular: No JVD.     Trachea: Trachea and phonation normal. No tracheal deviation.     Meningeal: Brudzinski's sign absent.  Cardiovascular:     Rate and Rhythm: Normal rate and regular rhythm.     Pulses:          Radial pulses  are 2+ on the right side and 2+ on the left side.       Dorsalis pedis pulses are 2+ on the right side and 2+ on the left side.     Heart sounds: Normal heart sounds. No murmur. No friction rub. No gallop.   Pulmonary:     Effort: Pulmonary effort is normal. No respiratory distress.     Breath sounds: Normal breath sounds. No stridor. No wheezing or rales.  Chest:     Chest wall: No tenderness.  Abdominal:     General: Bowel sounds are normal.     Palpations: Abdomen is soft.  Musculoskeletal: Normal range of motion.     Right lower leg: No edema.     Left lower leg: No edema.  Lymphadenopathy:     Head:     Right side of head: No submental, submandibular, tonsillar, preauricular, posterior auricular or occipital adenopathy.     Left side of head: No submental, submandibular, tonsillar, preauricular, posterior auricular or occipital adenopathy.  Cervical: Cervical adenopathy present.     Right cervical: Superficial cervical adenopathy present. No deep or posterior cervical adenopathy.    Left cervical: Superficial cervical adenopathy present. No deep or posterior cervical adenopathy.  Skin:    General: Skin is warm and dry.     Capillary Refill: Capillary refill takes less than 2 seconds.     Coloration: Skin is not pale.     Findings: No erythema or rash.     Nails: There is no clubbing.   Neurological:     General: No focal deficit present.     Mental Status: She is alert and oriented to person, place, and time.     Cranial Nerves: Cranial nerves are intact.     Sensory: Sensation is intact.     Motor: Motor function is intact.     Coordination: Coordination is intact.     Gait: Gait is intact.  Psychiatric:        Attention and Perception: Attention normal.        Mood and Affect: Mood normal.        Speech: Speech normal.        Behavior: Behavior normal. Behavior is cooperative.        Thought Content: Thought content normal.        Cognition and Memory: Cognition  normal.        Judgment: Judgment normal.        Assessment:  Recurrent  sinusitis, frontal and maxillary      Plan:      Averi was seen today for ear pain, nasal congestion and sinusitis.  Diagnoses and all orders for this visit:  Chronic sinusitis, unspecified location  Other orders -     levofloxacin (LEVAQUIN) 500 MG tablet; Take 1 tablet (500 mg total) by mouth daily. -     predniSONE (STERAPRED UNI-PAK 21 TAB) 10 MG (21) TBPK tablet; PO: Take 6 tablets on day 1:Take 5 tablets day 2:Take 4 tablets day 3: Take 3 tablets day 4:Take 2 tablets day five: 5 Take 1 tablet day 6  French Ana was instructed that she needs to follow-up with her primary care provider for any further recurrent sinusitis, for a referral to ear nose and throat physician.  She should maintain all of her prophylactic allergy medications given her environmental allergies, and possibly discussed with her primary care provider for a referral to an allergist if needed.  Discussed recurrent sinusitis can be malignancy , follow-up or other etiology and should be further investigated by ear nose and throat if another recurrence occurs. Provider discussed black box warning on Levaquin, risk versus benefits and patient verbalized understanding.  Discussed red flag symptoms and when to seek emergency medical treatment and seek immediately emergent care if needed.  Please see your primary care provider for a yearly physical and labs.  I will have the office call you on your glucose and cholesterol results when they return if you have not heard within 1 week please call the office and will have you follow up with your primary care doctor for any abnormal's. This biometric physical is not a substitute for seeing a primary care provider for a physical. Provider also recommends if you do not have a primary care provider for patient see a  primary care physician/ provider  for a routine physical and to establish primary care. Patient  may chose provider of choice. Also gave the [place]  PHYSICIAN/PROVIDER  REFERRAL LINE at [phone] or web  site at [email or web address] to help assist with finding a primary care doctor. Patient understands this office is acute care and no primary care is in this office.   . Follow up with primary care as needed for chronic and maintenance health care- can be seen in this employee clinic for acute care.    This is sick visit 10/06/18 sick visit separate from biometric and medications documented for acute sinusitis - recurrent here. Gave and reviewed After Visit Summary(AVS) with patient. Patient is advised to read the after visit summary as well and let the provider know if any question, concerns or clarifications are needed.   Advised patient call the office or your primary care doctor for an appointment if no improvement within 72 hours or if any symptoms change or worsen at any time  Advised ER or urgent Care if after hours or on weekend. Call 911 for emergency symptoms at any time.Patinet verbalized understanding of all instructions given/reviewed and treatment plan and has no further questions or concerns at this time.    Patient verbalized understanding of all instructions given and denies any further questions at this time.

## 2018-10-06 NOTE — Patient Instructions (Signed)
Please see your primary care provider for a yearly physical and labs.  I will have the office call you on your glucose and cholesterol results when they return if you have not heard within 1 week please call the office and will have you follow up with your primary care doctor for any abnormal's. This biometric physical is not a substitute for seeing a primary care provider for a physical. Provider also recommends if you do not have a primary care provider for patient see a  primary care physician/ provider  for a routine physical and to establish primary care. Patient may chose provider of choice. Also gave the [facility]  PHYSICIAN/PROVIDER  REFERRAL LINE at [phone] or web site at [email or web address] to help assist with finding a primary care doctor. Patient understands this office is acute care and no primary care is in this office.   Follow up with primary care as needed for chronic and maintenance health care- can be seen in this employee clinic for acute care.    Health Maintenance, Female Adopting a healthy lifestyle and getting preventive care can go a long way to promote health and wellness. Talk with your health care provider about what schedule of regular examinations is right for you. This is a good chance for you to check in with your provider about disease prevention and staying healthy. In between checkups, there are plenty of things you can do on your own. Experts have done a lot of research about which lifestyle changes and preventive measures are most likely to keep you healthy. Ask your health care provider for more information. Weight and diet Eat a healthy diet  Be sure to include plenty of vegetables, fruits, low-fat dairy products, and lean protein.  Do not eat a lot of foods high in solid fats, added sugars, or salt.  Get regular exercise. This is one of the most important things you can do for your health. ? Most adults should exercise for at least 150 minutes each  week. The exercise should increase your heart rate and make you sweat (moderate-intensity exercise). ? Most adults should also do strengthening exercises at least twice a week. This is in addition to the moderate-intensity exercise. Maintain a healthy weight  Body mass index (BMI) is a measurement that can be used to identify possible weight problems. It estimates body fat based on height and weight. Your health care provider can help determine your BMI and help you achieve or maintain a healthy weight.  For females 20 years of age and older: ? A BMI below 18.5 is considered underweight. ? A BMI of 18.5 to 24.9 is normal. ? A BMI of 25 to 29.9 is considered overweight. ? A BMI of 30 and above is considered obese. Watch levels of cholesterol and blood lipids  You should start having your blood tested for lipids and cholesterol at 51 years of age, then have this test every 5 years.  You may need to have your cholesterol levels checked more often if: ? Your lipid or cholesterol levels are high. ? You are older than 50 years of age. ? You are at high risk for heart disease. Cancer screening Lung Cancer  Lung cancer screening is recommended for adults 55-80 years old who are at high risk for lung cancer because of a history of smoking.  A yearly low-dose CT scan of the lungs is recommended for people who: ? Currently smoke. ? Have quit within the past 15 years. ?   Have at least a 30-pack-year history of smoking. A pack year is smoking an average of one pack of cigarettes a day for 1 year.  Yearly screening should continue until it has been 15 years since you quit.  Yearly screening should stop if you develop a health problem that would prevent you from having lung cancer treatment. Breast Cancer  Practice breast self-awareness. This means understanding how your breasts normally appear and feel.  It also means doing regular breast self-exams. Let your health care provider know about any  changes, no matter how small.  If you are in your 20s or 30s, you should have a clinical breast exam (CBE) by a health care provider every 1-3 years as part of a regular health exam.  If you are 60 or older, have a CBE every year. Also consider having a breast X-ray (mammogram) every year.  If you have a family history of breast cancer, talk to your health care provider about genetic screening.  If you are at high risk for breast cancer, talk to your health care provider about having an MRI and a mammogram every year.  Breast cancer gene (BRCA) assessment is recommended for women who have family members with BRCA-related cancers. BRCA-related cancers include: ? Breast. ? Ovarian. ? Tubal. ? Peritoneal cancers.  Results of the assessment will determine the need for genetic counseling and BRCA1 and BRCA2 testing. Cervical Cancer Your health care provider may recommend that you be screened regularly for cancer of the pelvic organs (ovaries, uterus, and vagina). This screening involves a pelvic examination, including checking for microscopic changes to the surface of your cervix (Pap test). You may be encouraged to have this screening done every 3 years, beginning at age 7.  For women ages 52-65, health care providers may recommend pelvic exams and Pap testing every 3 years, or they may recommend the Pap and pelvic exam, combined with testing for human papilloma virus (HPV), every 5 years. Some types of HPV increase your risk of cervical cancer. Testing for HPV may also be done on women of any age with unclear Pap test results.  Other health care providers may not recommend any screening for nonpregnant women who are considered low risk for pelvic cancer and who do not have symptoms. Ask your health care provider if a screening pelvic exam is right for you.  If you have had past treatment for cervical cancer or a condition that could lead to cancer, you need Pap tests and screening for cancer  for at least 20 years after your treatment. If Pap tests have been discontinued, your risk factors (such as having a new sexual partner) need to be reassessed to determine if screening should resume. Some women have medical problems that increase the chance of getting cervical cancer. In these cases, your health care provider may recommend more frequent screening and Pap tests. Colorectal Cancer  This type of cancer can be detected and often prevented.  Routine colorectal cancer screening usually begins at 51 years of age and continues through 51 years of age.  Your health care provider may recommend screening at an earlier age if you have risk factors for colon cancer.  Your health care provider may also recommend using home test kits to check for hidden blood in the stool.  A small camera at the end of a tube can be used to examine your colon directly (sigmoidoscopy or colonoscopy). This is done to check for the earliest forms of colorectal cancer.  Routine  screening usually begins at age 36.  Direct examination of the colon should be repeated every 5-10 years through 51 years of age. However, you may need to be screened more often if early forms of precancerous polyps or small growths are found. Skin Cancer  Check your skin from head to toe regularly.  Tell your health care provider about any new moles or changes in moles, especially if there is a change in a mole's shape or color.  Also tell your health care provider if you have a mole that is larger than the size of a pencil eraser.  Always use sunscreen. Apply sunscreen liberally and repeatedly throughout the day.  Protect yourself by wearing long sleeves, pants, a wide-brimmed hat, and sunglasses whenever you are outside. Heart disease, diabetes, and high blood pressure  High blood pressure causes heart disease and increases the risk of stroke. High blood pressure is more likely to develop in: ? People who have blood pressure in  the high end of the normal range (130-139/85-89 mm Hg). ? People who are overweight or obese. ? People who are African American.  If you are 24-64 years of age, have your blood pressure checked every 3-5 years. If you are 38 years of age or older, have your blood pressure checked every year. You should have your blood pressure measured twice-once when you are at a hospital or clinic, and once when you are not at a hospital or clinic. Record the average of the two measurements. To check your blood pressure when you are not at a hospital or clinic, you can use: ? An automated blood pressure machine at a pharmacy. ? A home blood pressure monitor.  If you are between 35 years and 72 years old, ask your health care provider if you should take aspirin to prevent strokes.  Have regular diabetes screenings. This involves taking a blood sample to check your fasting blood sugar level. ? If you are at a normal weight and have a low risk for diabetes, have this test once every three years after 51 years of age. ? If you are overweight and have a high risk for diabetes, consider being tested at a younger age or more often. Preventing infection Hepatitis B  If you have a higher risk for hepatitis B, you should be screened for this virus. You are considered at high risk for hepatitis B if: ? You were born in a country where hepatitis B is common. Ask your health care provider which countries are considered high risk. ? Your parents were born in a high-risk country, and you have not been immunized against hepatitis B (hepatitis B vaccine). ? You have HIV or AIDS. ? You use needles to inject street drugs. ? You live with someone who has hepatitis B. ? You have had sex with someone who has hepatitis B. ? You get hemodialysis treatment. ? You take certain medicines for conditions, including cancer, organ transplantation, and autoimmune conditions. Hepatitis C  Blood testing is recommended for: ? Everyone  born from [birth year] through 1963. ? Anyone with known risk factors for hepatitis C. Sexually transmitted infections (STIs)  You should be screened for sexually transmitted infections (STIs) including gonorrhea and chlamydia if: ? You are sexually active and are younger than 51 years of age. ? You are older than 51 years of age and your health care provider tells you that you are at risk for this type of infection. ? Your sexual activity has changed since you were  last screened and you are at an increased risk for chlamydia or gonorrhea. Ask your health care provider if you are at risk.  If you do not have HIV, but are at risk, it may be recommended that you take a prescription medicine daily to prevent HIV infection. This is called pre-exposure prophylaxis (PrEP). You are considered at risk if: ? You are sexually active and do not regularly use condoms or know the HIV status of your partner(s). ? You take drugs by injection. ? You are sexually active with a partner who has HIV. Talk with your health care provider about whether you are at high risk of being infected with HIV. If you choose to begin PrEP, you should first be tested for HIV. You should then be tested every 3 months for as long as you are taking PrEP. Pregnancy  If you are premenopausal and you may become pregnant, ask your health care provider about preconception counseling.  If you may become pregnant, take 400 to 800 micrograms (mcg) of folic acid every day.  If you want to prevent pregnancy, talk to your health care provider about birth control (contraception). Osteoporosis and menopause  Osteoporosis is a disease in which the bones lose minerals and strength with aging. This can result in serious bone fractures. Your risk for osteoporosis can be identified using a bone density scan.  If you are 69 years of age or older, or if you are at risk for osteoporosis and fractures, ask your health care provider if you should be  screened.  Ask your health care provider whether you should take a calcium or vitamin D supplement to lower your risk for osteoporosis.  Menopause may have certain physical symptoms and risks.  Hormone replacement therapy may reduce some of these symptoms and risks. Talk to your health care provider about whether hormone replacement therapy is right for you. Follow these instructions at home:  Schedule regular health, dental, and eye exams.  Stay current with your immunizations.  Do not use any tobacco products including cigarettes, chewing tobacco, or electronic cigarettes.  If you are pregnant, do not drink alcohol.  If you are breastfeeding, limit how much and how often you drink alcohol.  Limit alcohol intake to no more than 1 drink per day for nonpregnant women. One drink equals 12 ounces of beer, 5 ounces of wine, or 1 ounces of hard liquor.  Do not use street drugs.  Do not share needles.  Ask your health care provider for help if you need support or information about quitting drugs.  Tell your health care provider if you often feel depressed.  Tell your health care provider if you have ever been abused or do not feel safe at home. This information is not intended to replace advice given to you by your health care provider. Make sure you discuss any questions you have with your health care provider. Document Released: 01/25/2011 Document Revised: 12/18/2015 Document Reviewed: 04/15/2015 Elsevier Interactive Patient Education  2019 [employer].

## 2018-10-06 NOTE — Progress Notes (Signed)
[facility]  Subjective:     Patient ID: Isabel Hines, female   DOB: [birth year], 53 y.o.   MRN: [id number]  HPI  Pulse 86, temperature 99.3 F (37.4 C), temperature source Oral, resp. rate 14, SpO2 97 %.    Patient is a 53 year female  in no acute distress who comes to the clinic for a biometric screening with brief biometric physical with labs include fasting glucose and lipids.  [name], MD is her primary care sees regularly. She has a gynecologist sees yearly.   Going to [employer] trying to work out and eat healthy- she is planning on getting married in the spring. She reports she is happy and excited.   Smoker - trying to quit - stopped for the past week and feels she is doing well.   She has had recurrent sinusitis see sick visit note from today's office visit. She has sinus pressure, bilateral ear pain. She is using Zyrtec, Azestaline nasal spray, a;;etgy eye drops and was treated by her primary care provider with Amoxicillin and Prednisone- she reports no improvement since 09/20/18.0 She reports her symptoms started again after being seen for otitis media on 1/22/520 and taking Amoxicillin at that time. She reports she improved completely and symptoms restarted on around 09/04/18.  Patient  denies any fever, body aches,chills, rash, chest pain, shortness of breath, nausea, vomiting, or diarrhea.    Allergies  Allergen Reactions  . Keflex [Cephalexin] Hives  . Sulfa Antibiotics Hives and Itching  . Sulfasalazine Hives  . Hydrocodone Itching  . Tramadol Hives   Patient Active Problem List   Diagnosis Date Noted  . Other specified diseases of intestine   . Left lower quadrant pain   . Insomnia 06/25/2015  . Depression 04/02/2015  . Abnormal cervical Pap smear with positive HPV DNA test 06/10/2014  . Anxiety   . GERD (gastroesophageal reflux disease)      Review of Systems  Constitutional: Positive for fatigue.  Negative for activity change, appetite change, chills, diaphoresis, fever and unexpected weight change.  HENT: Positive for congestion, postnasal drip, rhinorrhea, sinus pressure, sinus pain and sore throat. Negative for dental problem, drooling, ear discharge, ear pain, facial swelling, hearing loss, mouth sores, nosebleeds, sneezing, tinnitus, trouble swallowing and voice change.   Eyes: Positive for discharge (bilateral watery eyes ) and itching. Negative for photophobia, pain, redness and visual disturbance.  Respiratory: Negative.  Negative for apnea, cough, choking, chest tightness, shortness of breath, wheezing and stridor.   Cardiovascular: Negative.  Negative for chest pain, palpitations and leg swelling.  Gastrointestinal: Negative.   Endocrine: Negative.   Genitourinary: Negative.   Musculoskeletal: Negative.   Skin: Negative.   Allergic/Immunologic: Positive for environmental allergies. Negative for food allergies and immunocompromised state.        -- Keflex (Cephalexin) -- Hives  -- Sulfa Antibiotics -- Hives and Itching  -- Sulfasalazine -- Hives  -- Hydrocodone -- Itching  -- Tramadol -- Hives   Neurological: Positive for headaches. Negative for dizziness, tremors, seizures, syncope, facial asymmetry, speech difficulty, weakness, light-headedness and numbness.  Hematological: Negative.   Psychiatric/Behavioral: Negative.        Objective:   Physical Exam Vitals signs reviewed.  Constitutional:      General: She is not in acute distress.    Appearance: Normal appearance. She is well-developed. She is not ill-appearing, toxic-appearing or diaphoretic.  HENT:     Head: Normocephalic and atraumatic.  Jaw: There is normal jaw occlusion.     Salivary Glands: Right salivary gland is not diffusely enlarged or tender. Left salivary gland is not diffusely enlarged or tender.     Right Ear: Hearing, ear canal and external ear normal. No drainage, swelling or tenderness. A  middle ear effusion is present. There is no impacted cerumen. Tympanic membrane is erythematous. Tympanic membrane is not perforated, retracted or bulging.     Left Ear: Hearing, ear canal and external ear normal. No drainage, swelling or tenderness. A middle ear effusion is present. There is no impacted cerumen. Tympanic membrane is erythematous. Tympanic membrane is not perforated, retracted or bulging.     Nose: Mucosal edema, congestion and rhinorrhea present.     Right Sinus: Maxillary sinus tenderness and frontal sinus tenderness present.     Left Sinus: Maxillary sinus tenderness and frontal sinus tenderness present.     Mouth/Throat:     Lips: Pink.     Mouth: Mucous membranes are moist.     Pharynx: Oropharynx is clear. Uvula midline. Posterior oropharyngeal erythema present. No pharyngeal swelling or oropharyngeal exudate.     Tonsils: No tonsillar exudate or tonsillar abscesses. Swelling: 1+ on the right. 1+ on the left.     Comments:  Cobblestoning posterior pharynx; bilateral allergic shiners; bilateral TMs air fluid level clear; bilateral nasal turbinates moderate edema erythema; yellow brown purulent  discharge;    Eyes:     General: Lids are normal. No scleral icterus.       Right eye: No discharge.        Left eye: No discharge.     Conjunctiva/sclera: Conjunctivae normal.     Pupils: Pupils are equal, round, and reactive to light.  Neck:     Musculoskeletal: Full passive range of motion without pain, normal range of motion and neck supple. Normal range of motion. No edema or muscular tenderness.     Thyroid: No thyromegaly.     Vascular: No JVD.     Trachea: Trachea and phonation normal. No tracheal deviation.     Meningeal: Brudzinski's sign absent.  Cardiovascular:     Rate and Rhythm: Normal rate and regular rhythm.     Pulses:          Radial pulses are 2+ on the right side and 2+ on the left side.       Dorsalis pedis pulses are 2+ on the right side and 2+ on the  left side.     Heart sounds: Normal heart sounds. No murmur. No friction rub. No gallop.   Pulmonary:     Effort: Pulmonary effort is normal. No respiratory distress.     Breath sounds: Normal breath sounds. No stridor. No wheezing or rales.  Chest:     Chest wall: No tenderness.  Abdominal:     General: Bowel sounds are normal.     Palpations: Abdomen is soft.  Musculoskeletal: Normal range of motion.     Right lower leg: No edema.     Left lower leg: No edema.  Lymphadenopathy:     Head:     Right side of head: No submental, submandibular, tonsillar, preauricular, posterior auricular or occipital adenopathy.     Left side of head: No submental, submandibular, tonsillar, preauricular, posterior auricular or occipital adenopathy.     Cervical: Cervical adenopathy present.     Right cervical: Superficial cervical adenopathy present. No deep or posterior cervical adenopathy.    Left cervical: Superficial cervical adenopathy present.  No deep or posterior cervical adenopathy.  Skin:    General: Skin is warm and dry.     Capillary Refill: Capillary refill takes less than 2 seconds.     Coloration: Skin is not pale.     Findings: No erythema or rash.     Nails: There is no clubbing.   Neurological:     General: No focal deficit present.     Mental Status: She is alert and oriented to person, place, and time.     Cranial Nerves: Cranial nerves are intact.     Sensory: Sensation is intact.     Motor: Motor function is intact.     Coordination: Coordination is intact.     Gait: Gait is intact.  Psychiatric:        Attention and Perception: Attention normal.        Mood and Affect: Mood normal.        Speech: Speech normal.        Behavior: Behavior normal. Behavior is cooperative.        Thought Content: Thought content normal.        Cognition and Memory: Cognition normal.        Judgment: Judgment normal.        Assessment:     Encounter for biometric screening - Plan: Lipid  Panel With LDL/HDL Ratio, Glucose, random      Plan:     Isabel Hines was seen today for biometric physical.  Diagnoses and all orders for this visit:  Encounter for biometric screening -     Lipid Panel With LDL/HDL Ratio -     Glucose, random  Please see your primary care provider for a yearly physical and labs.  I will have the office call you on your glucose and cholesterol results when they return if you have not heard within 1 week please call the office and will have you follow up with your primary care doctor for any abnormal's. This biometric physical is not a substitute for seeing a primary care provider for a physical. Provider also recommends if you do not have a primary care provider for patient see a  primary care physician/ provider  for a routine physical and to establish primary care. Patient may chose provider of choice. Also gave the [place]  PHYSICIAN/PROVIDER  REFERRAL LINE at [phone] or web site at [email or web address] to help assist with finding a primary care doctor. Patient understands this office is acute care and no primary care is in this office.   . Follow up with primary care as needed for chronic and maintenance health care- can be seen in this employee clinic for acute care.    See visit 10/06/18 sick visit separate from biometric and medications documented for acute sinusitis - recurrent.  Gave and reviewed After Visit Summary(AVS) with patient. Patient is advised to read the after visit summary as well and let the provider know if any question, concerns or clarifications are needed.   Advised patient call the office or your primary care doctor for an appointment if no improvement within 72 hours or if any symptoms change or worsen at any time  Advised ER or urgent Care if after hours or on weekend. Call 911 for emergency symptoms at any time.Patinet verbalized understanding of all instructions given/reviewed and treatment plan and has no further questions or  concerns at this time.    Patient verbalized understanding of all instructions given and denies any further questions  at this time.

## 2018-10-07 LAB — LIPID PANEL WITH LDL/HDL RATIO
Cholesterol, Total: 216 mg/dL — ABNORMAL HIGH (ref 100–199)
HDL: 43 mg/dL (ref >39)
LDL CALC: 126 mg/dL — AB (ref 0–99)
LDL/HDL RATIO: 2.9 ratio (ref 0.0–3.2)
Triglycerides: 236 mg/dL — ABNORMAL HIGH (ref 0–149)
VLDL CHOLESTEROL CAL: 47 mg/dL — AB (ref 5–40)

## 2018-10-07 LAB — GLUCOSE, RANDOM: GLUCOSE: 99 mg/dL (ref 65–99)

## 2018-11-15 NOTE — Addendum Note (Signed)
Addended by: Berniece Pap on: 11/15/2018 11:54 AM   Modules accepted: Level of Service

## 2019-09-03 ENCOUNTER — Other Ambulatory Visit: Payer: Self-pay | Admitting: Obstetrics and Gynecology

## 2019-09-03 DIAGNOSIS — Z1231 Encounter for screening mammogram for malignant neoplasm of breast: Secondary | ICD-10-CM

## 2019-10-15 ENCOUNTER — Ambulatory Visit
Admission: RE | Admit: 2019-10-15 | Discharge: 2019-10-15 | Disposition: A | Discharge status: Home / Self Care | Payer: Managed Care, Other (non HMO) | Source: Ambulatory Visit | Attending: Obstetrics and Gynecology | Admitting: Obstetrics and Gynecology

## 2019-10-15 DIAGNOSIS — Z1231 Encounter for screening mammogram for malignant neoplasm of breast: Secondary | ICD-10-CM | POA: Insufficient documentation

## 2019-10-15 IMAGING — MG DIGITAL SCREENING BILAT W/ TOMO W/ CAD
8 series · 8 of 24 positions shown · non-contrast
Comparison: Previous exam(s).

CLINICAL DATA: Screening.

EXAM:
DIGITAL SCREENING BILATERAL MAMMOGRAM WITH TOMO AND CAD

[L MLO synth-2D]
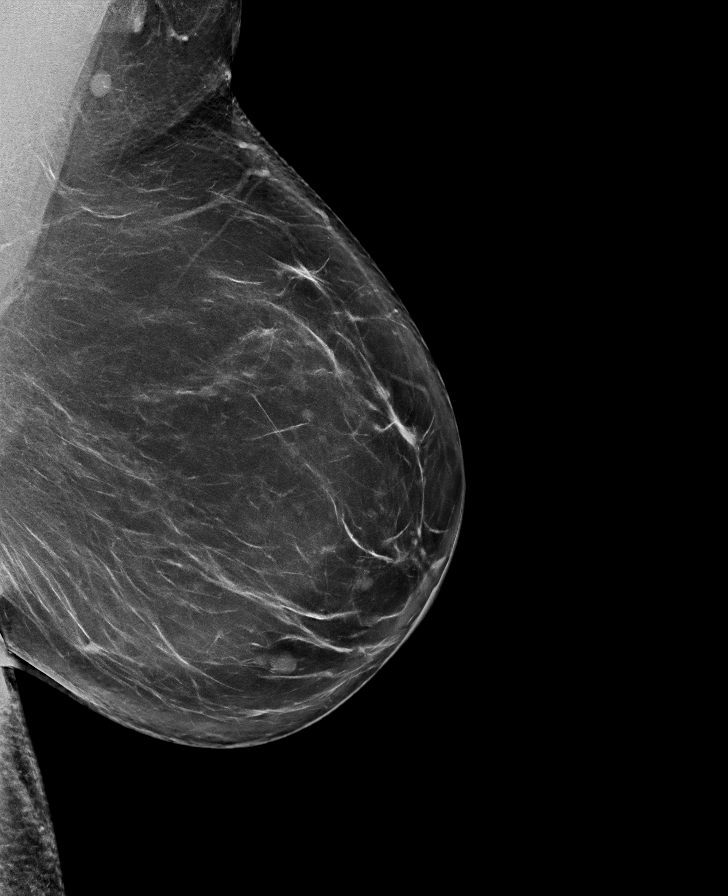

[L CC synth-2D]
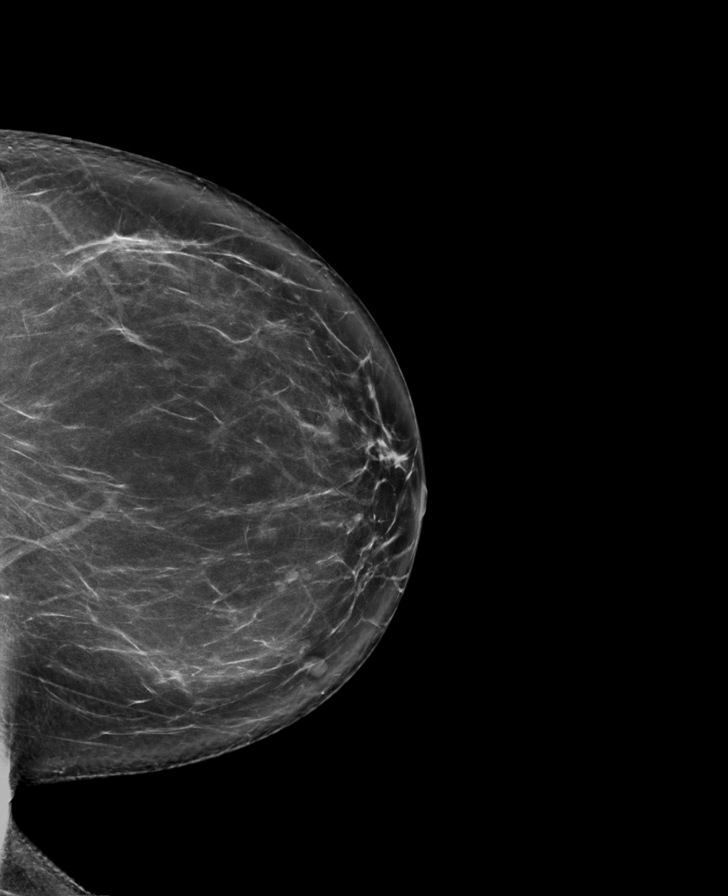

[R CC synth-2D]
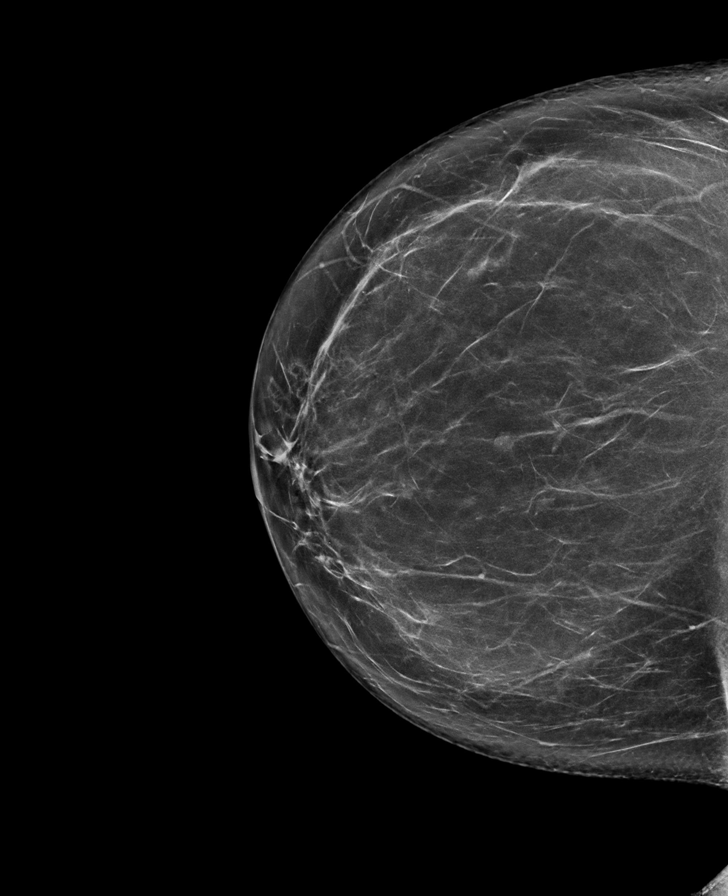

[R MLO synth-2D]
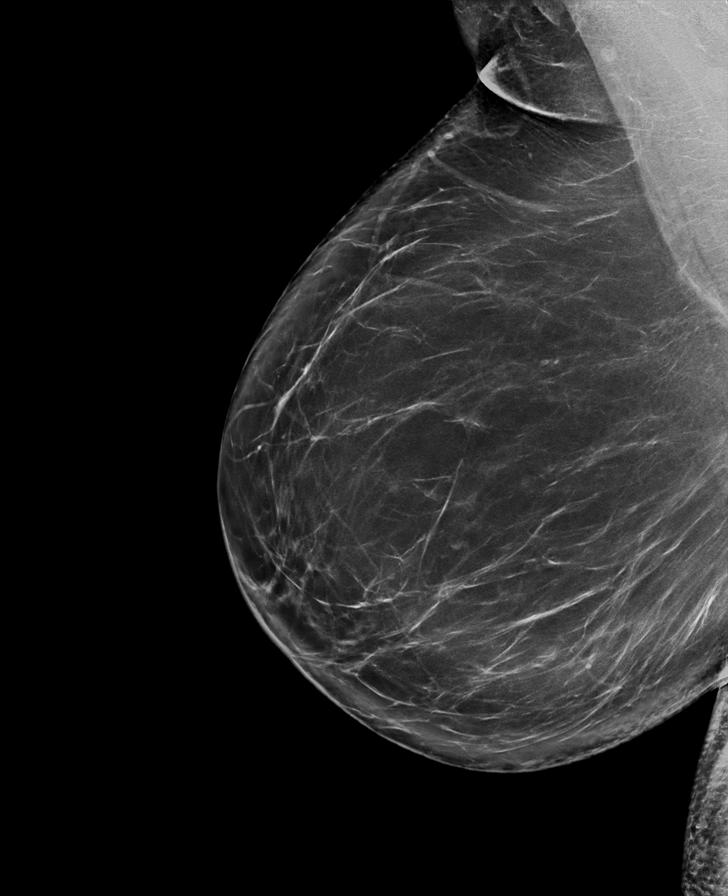

[R CC tomo · tomo slice 41/82.0]
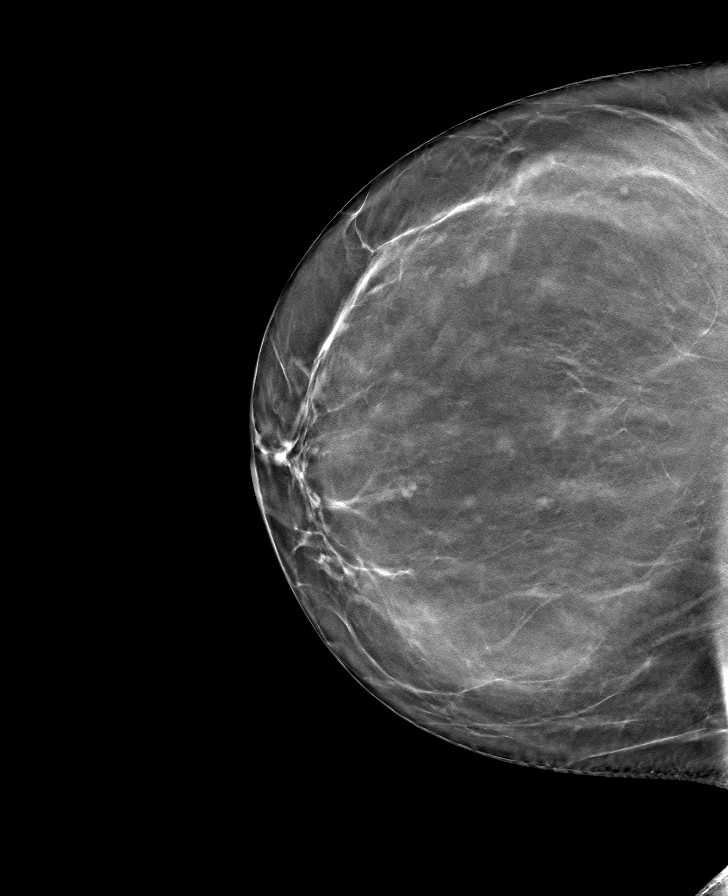

[L CC tomo · tomo slice 43/86.0]
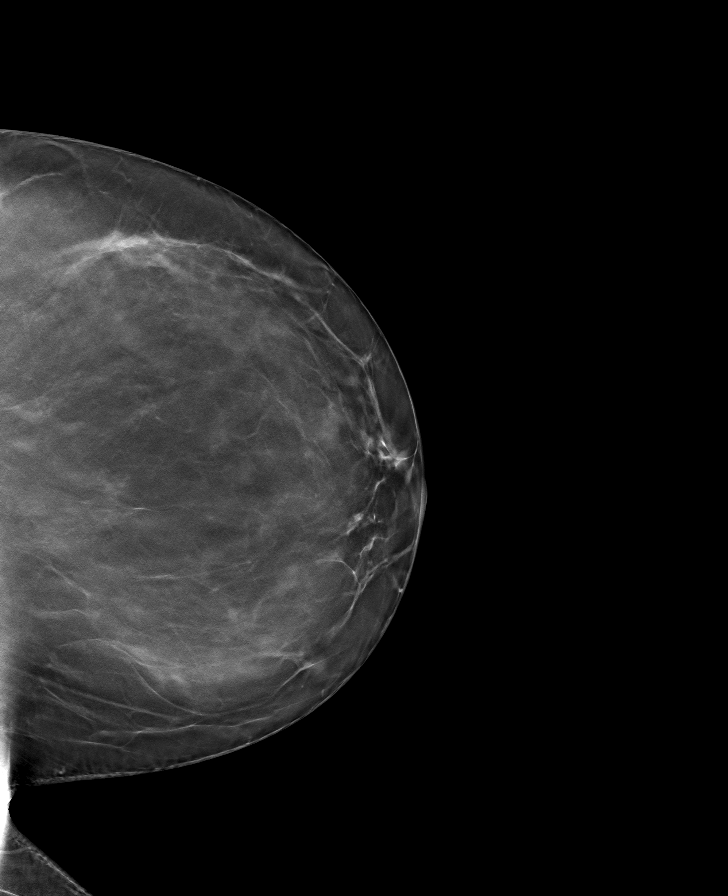

[R MLO tomo · tomo slice 46/91.0]
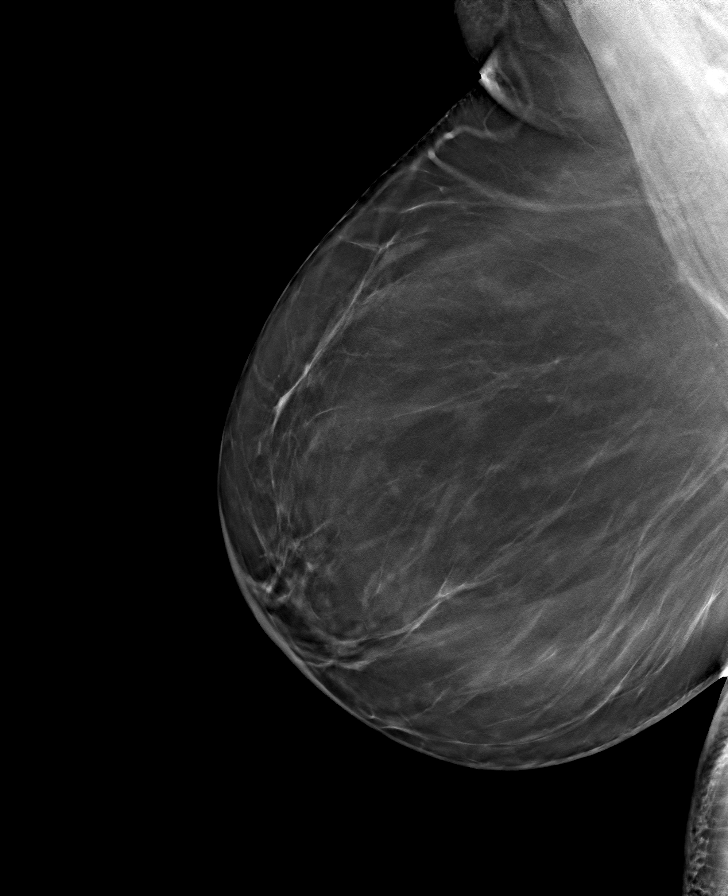

[L MLO tomo · tomo slice 46/91.0]
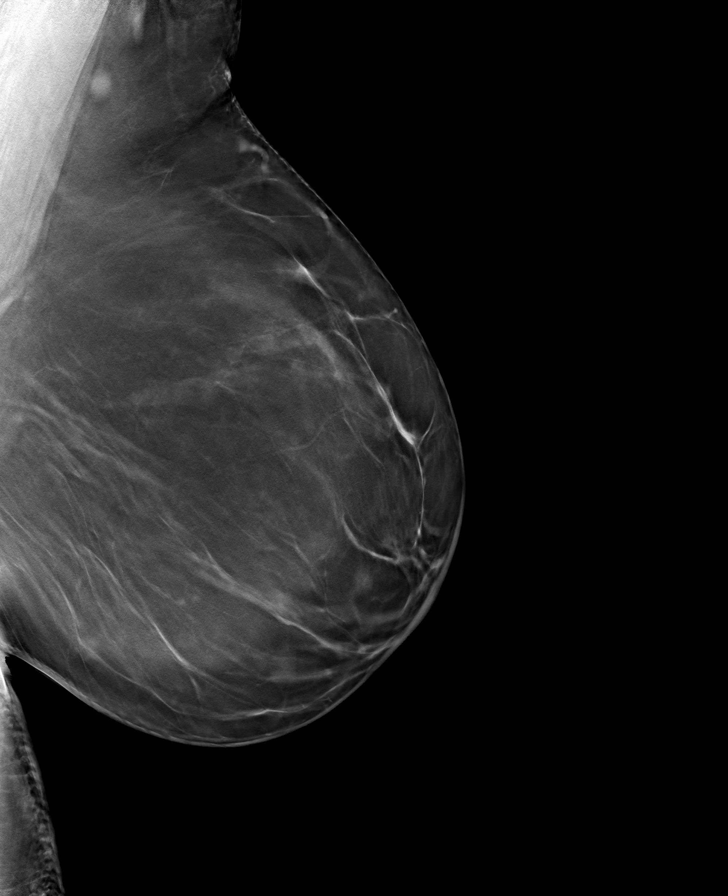

[8 of 24 positions shown; findings below may reference images not displayed]

ACR Breast Density Category b: There are scattered areas of
fibroglandular density.
FINDINGS: In the left breast, a possible mass warrants further evaluation. In
the right breast, no findings suspicious for malignancy.

Images were processed with CAD.
IMPRESSION: Further evaluation is suggested for possible mass in the left
breast.

RECOMMENDATION:
Diagnostic mammogram and possibly ultrasound of the left breast.
(Code:[R1])

The patient will be contacted regarding the findings, and additional
imaging will be scheduled.

BI-RADS CATEGORY  0: Incomplete. Need additional imaging evaluation
and/or prior mammograms for comparison.

## 2019-10-17 ENCOUNTER — Other Ambulatory Visit: Payer: Self-pay | Admitting: Obstetrics and Gynecology

## 2019-10-17 DIAGNOSIS — R928 Other abnormal and inconclusive findings on diagnostic imaging of breast: Secondary | ICD-10-CM

## 2019-10-17 DIAGNOSIS — N6320 Unspecified lump in the left breast, unspecified quadrant: Secondary | ICD-10-CM

## 2019-10-26 ENCOUNTER — Ambulatory Visit
Admission: RE | Admit: 2019-10-26 | Discharge: 2019-10-26 | Disposition: A | Discharge status: Home / Self Care | Payer: Managed Care, Other (non HMO) | Source: Ambulatory Visit | Attending: Obstetrics and Gynecology | Admitting: Obstetrics and Gynecology

## 2019-10-26 DIAGNOSIS — N6320 Unspecified lump in the left breast, unspecified quadrant: Secondary | ICD-10-CM | POA: Diagnosis present

## 2019-10-26 DIAGNOSIS — R928 Other abnormal and inconclusive findings on diagnostic imaging of breast: Secondary | ICD-10-CM | POA: Insufficient documentation

## 2019-10-26 IMAGING — US US BREAST*L* LIMITED INC AXILLA
1 series · 7 of 7 positions shown · non-contrast
Comparison: Previous exam(s).

CLINICAL DATA: 53-year-old female presenting as a recall from
screening for possible left breast mass.

EXAM:
DIGITAL DIAGNOSTIC LEFT MAMMOGRAM WITH TOMO
ULTRASOUND LEFT BREAST

[Series 1: us breast*left* limited inc axilla · 0.05mm/px · 7 of 7 slices shown]
[im 1/7]
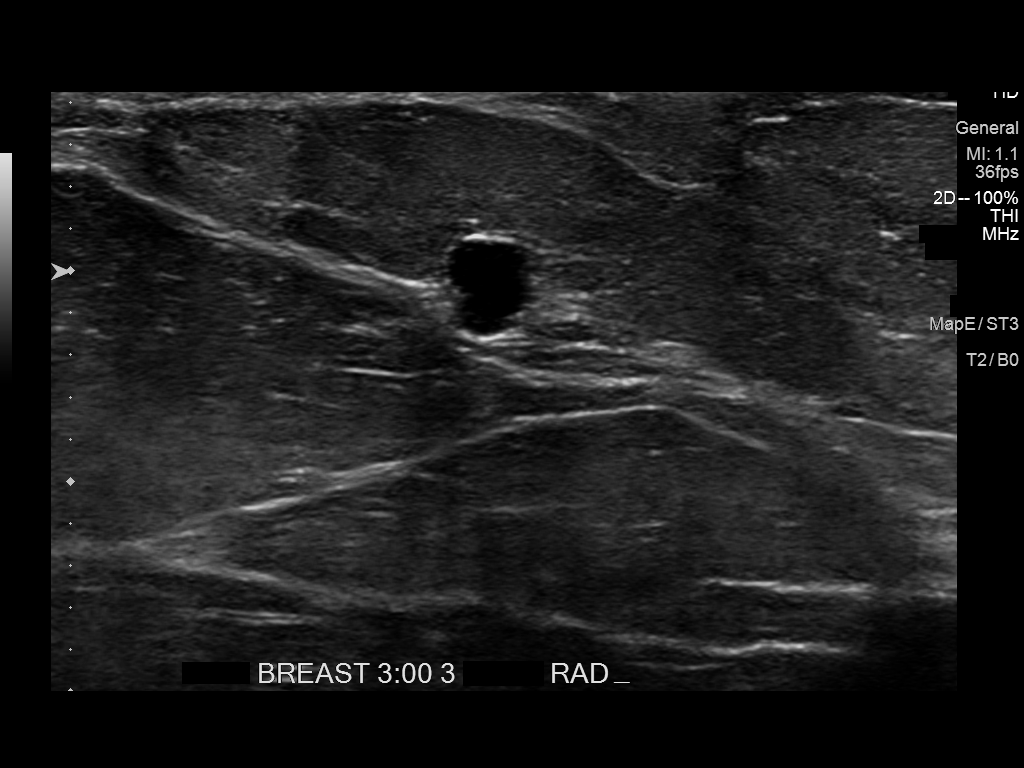
[im 2/7]
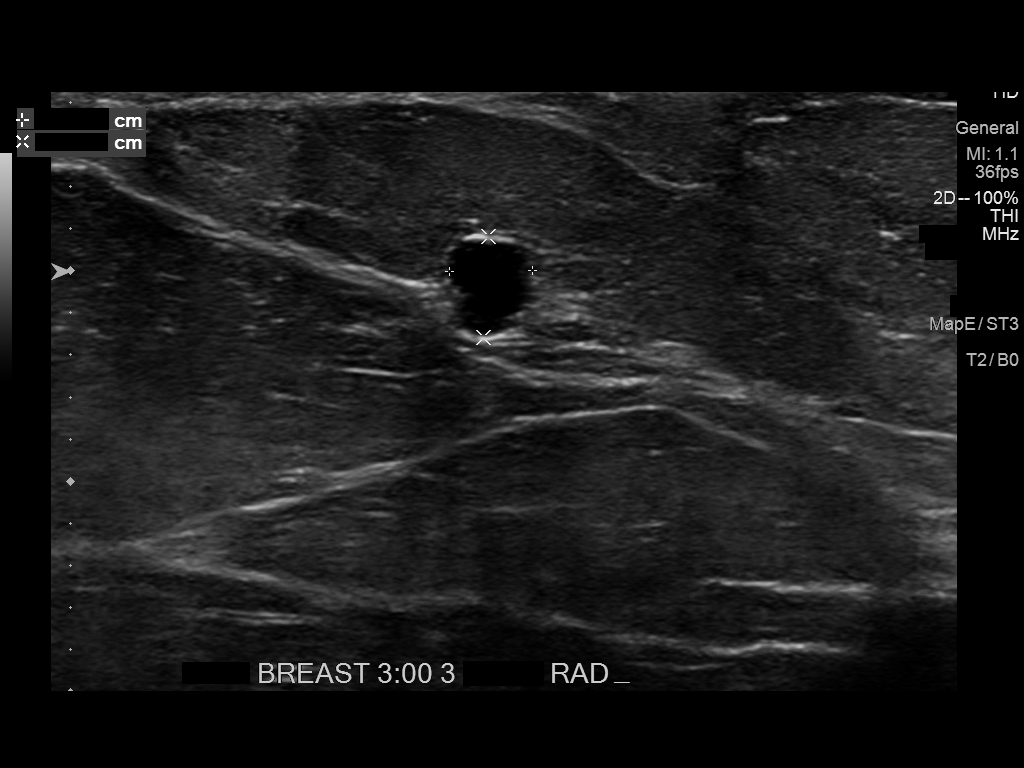
[im 3/7]
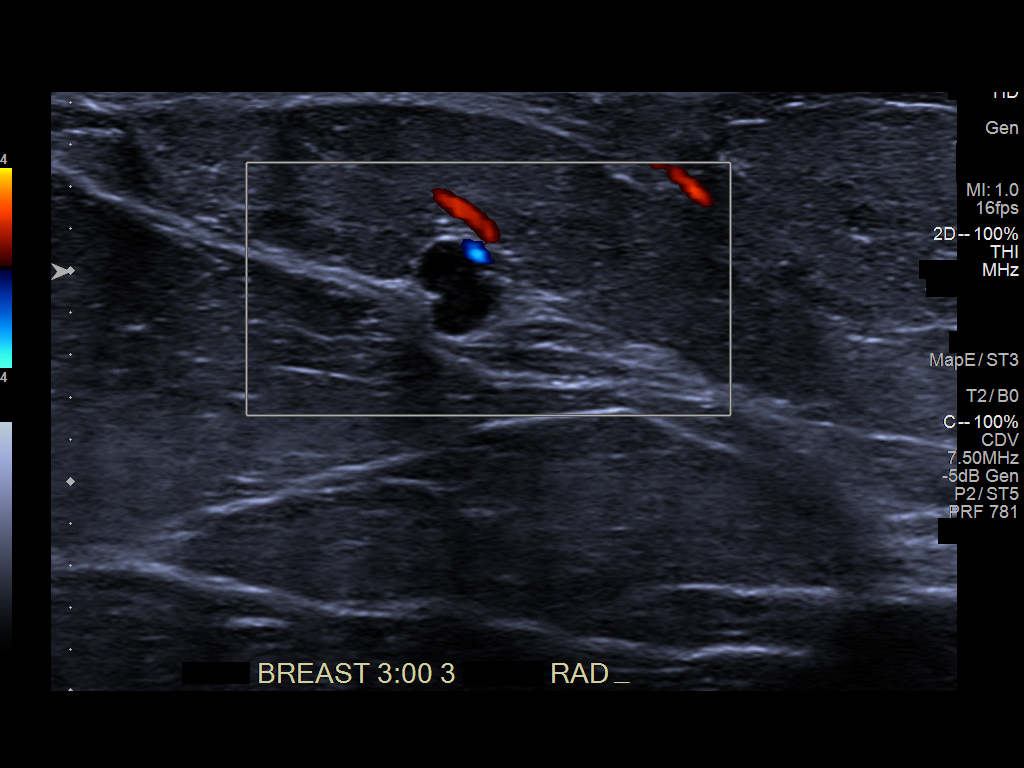
[im 4/7]
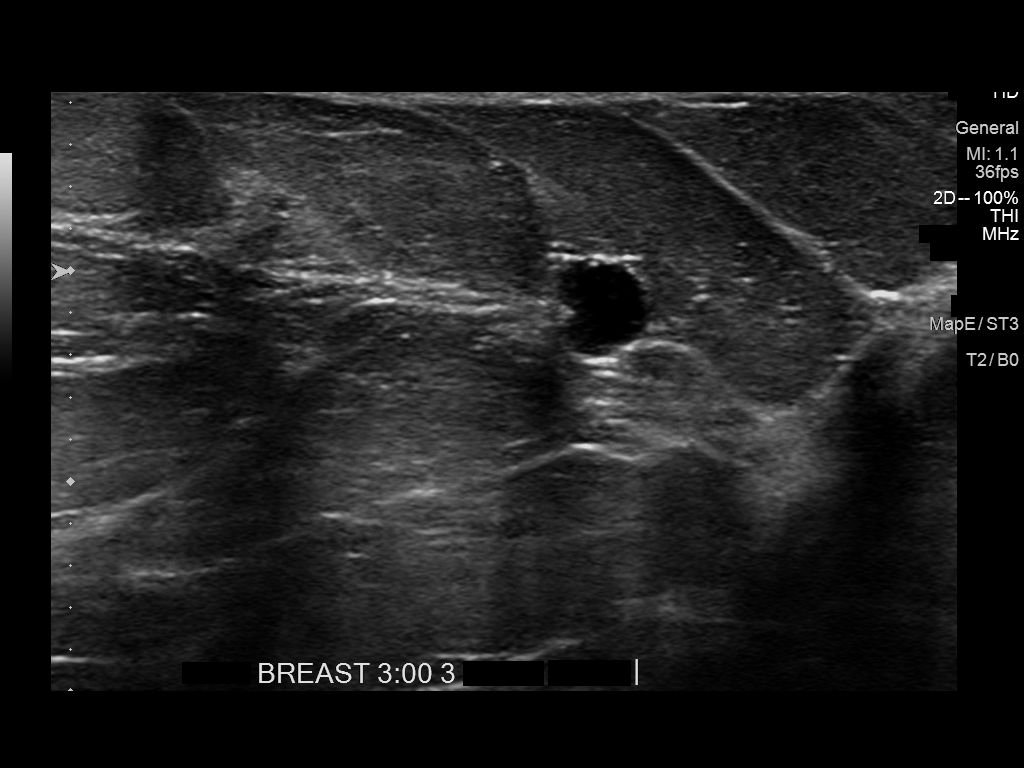
[im 5/7]
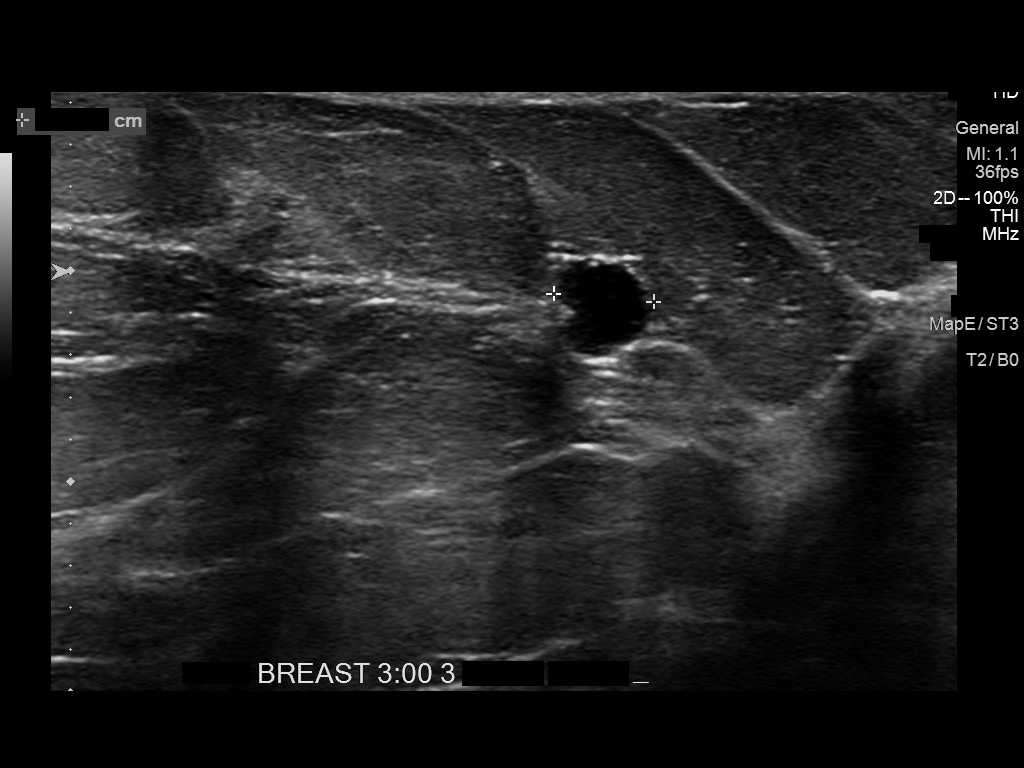
[im 6/7]
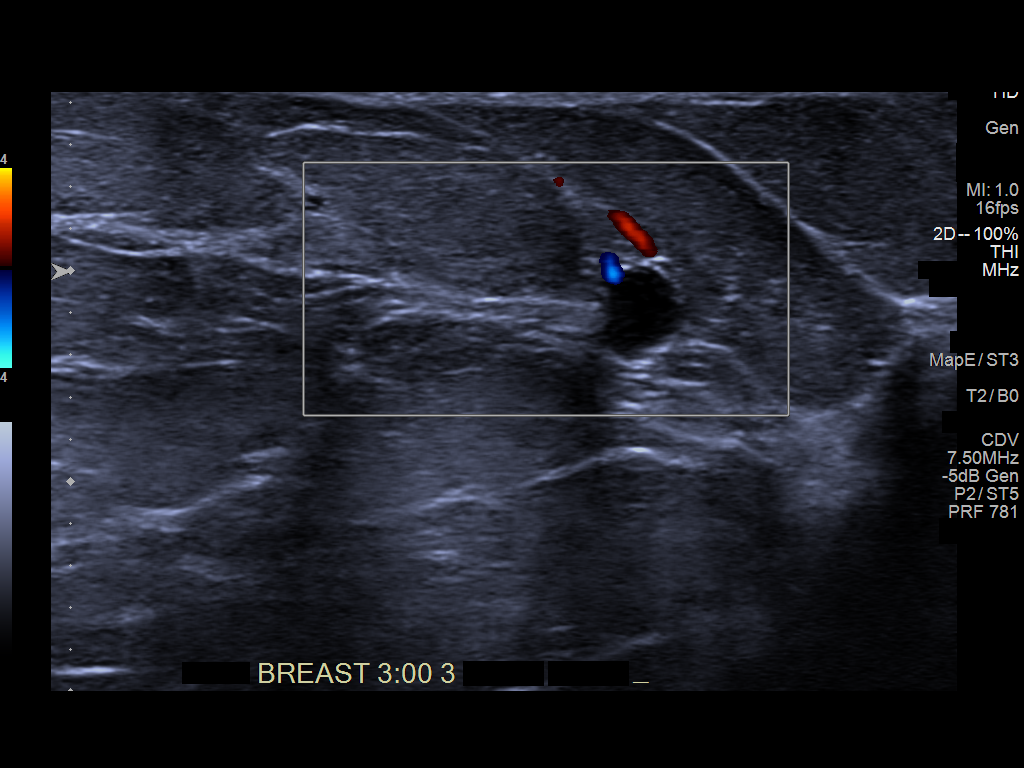
[im 7/7]
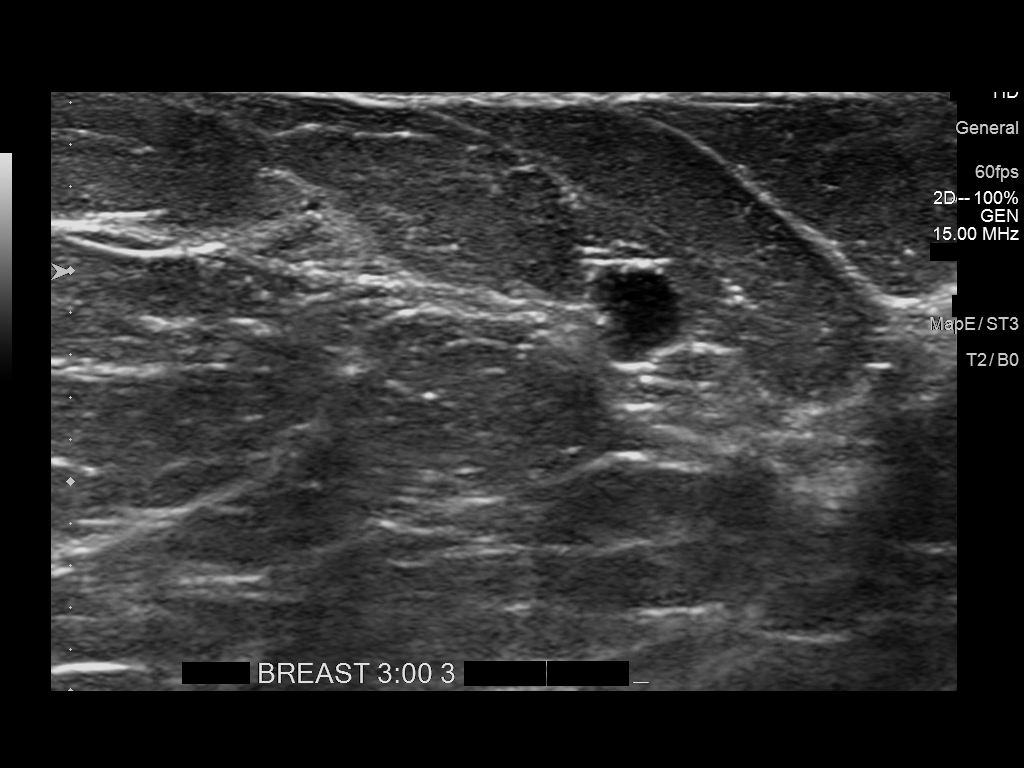

[7 of 7 positions shown; findings below may reference images not displayed]

ACR Breast Density Category b: There are scattered areas of
fibroglandular density.
FINDINGS: Mammogram:

Additional spot compression tomosynthesis views were performed of
the left breast for the questioned mass in the outer periareolar
region. On the additional imaging there is persistence of an oval
circumscribed mass measuring approximately 0.4 cm. No new findings
identified.

Ultrasound:

Targeted ultrasound is performed in the left breast at 3 o'clock 3
cm from the nipple demonstrating an oval circumscribed hypoechoic
mass measuring 0.4 x 0.5 x 0.5 cm. No internal blood flow. This
corresponds to the mammographic finding.
IMPRESSION: Left breast mass at 3 o'clock measuring 0.5 cm is probably benign
and most likely represents a complicated cyst.

RECOMMENDATION:
Left breast ultrasound in 6 months.

I have discussed the findings and recommendations with the patient.
If applicable, a reminder letter will be sent to the patient
regarding the next appointment.

BI-RADS CATEGORY  3: Probably benign.

## 2019-10-26 IMAGING — MG MM DIGITAL DIAGNOSTIC UNILAT*L* W/ TOMO W/ CAD
4 series · 4 of 12 positions shown · non-contrast
Comparison: Previous exam(s).

CLINICAL DATA: 53-year-old female presenting as a recall from
screening for possible left breast mass.

EXAM:
DIGITAL DIAGNOSTIC LEFT MAMMOGRAM WITH TOMO
ULTRASOUND LEFT BREAST

[L MLO synth-2D]
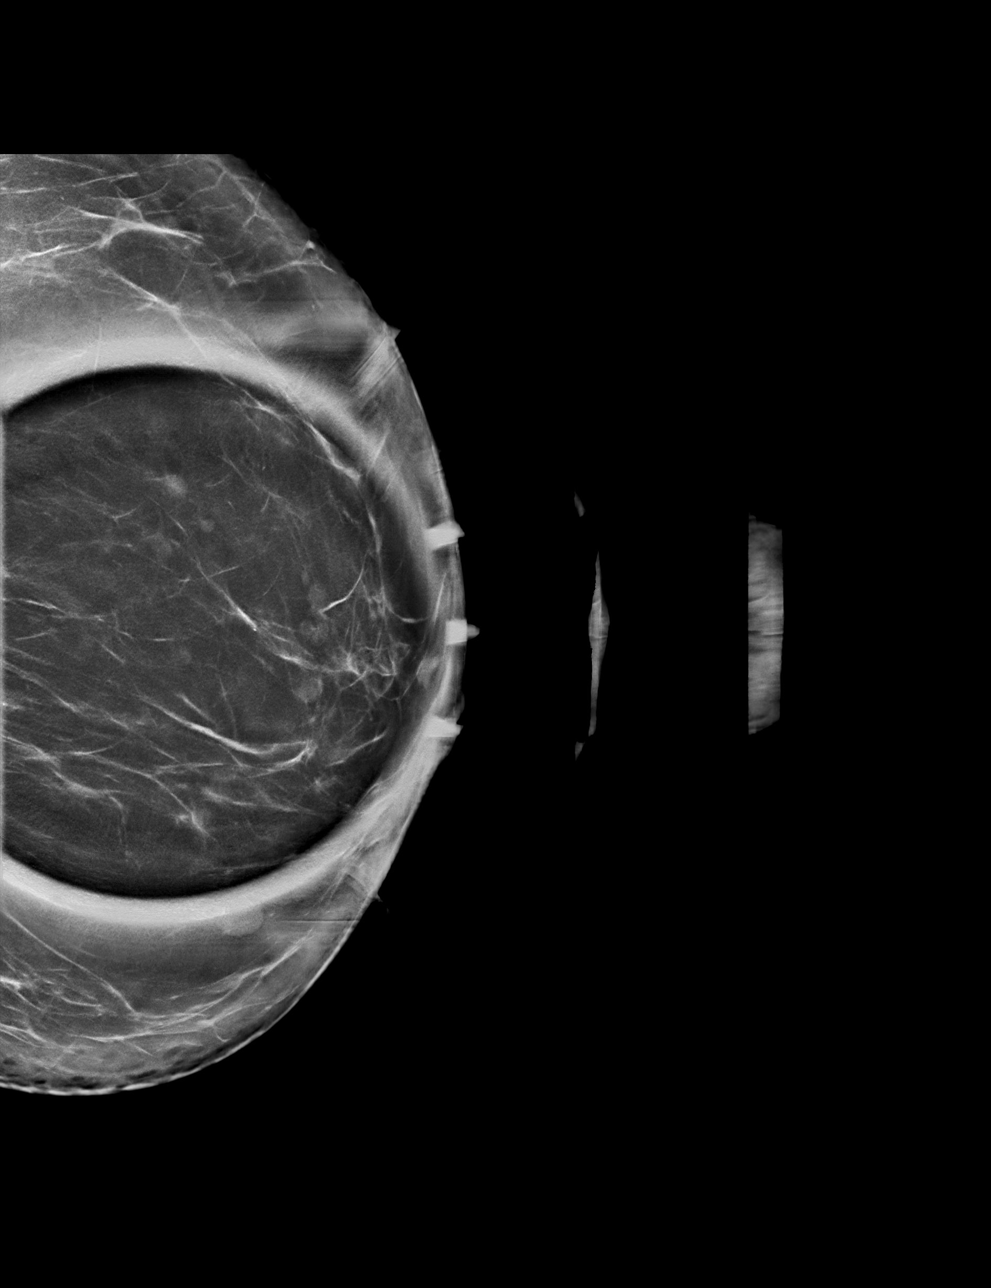

[L CC synth-2D]
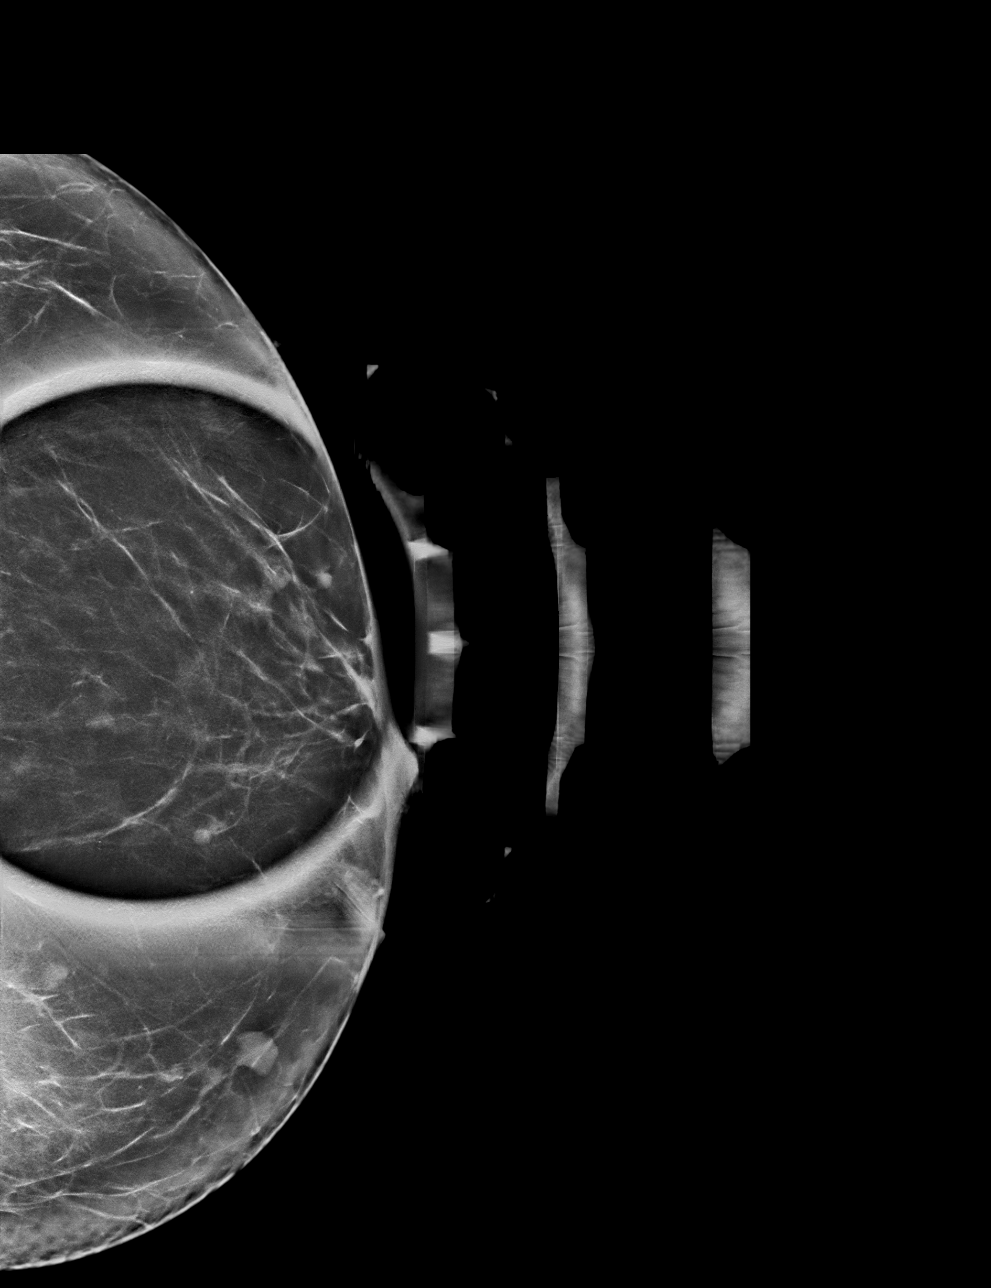

[L MLO tomo · tomo slice 45/88.0]
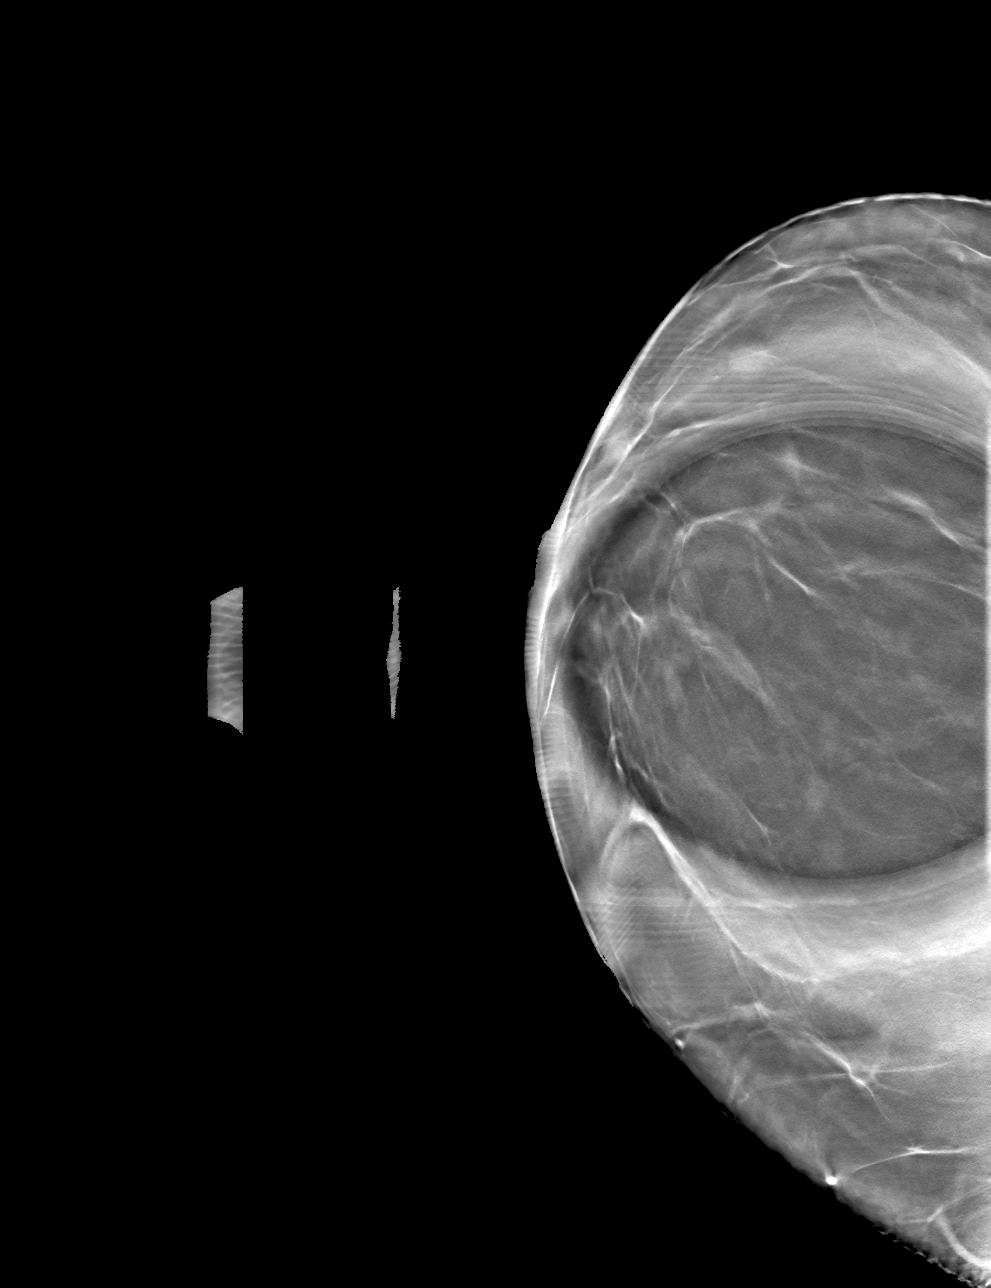

[L CC tomo · tomo slice 36/71.0]
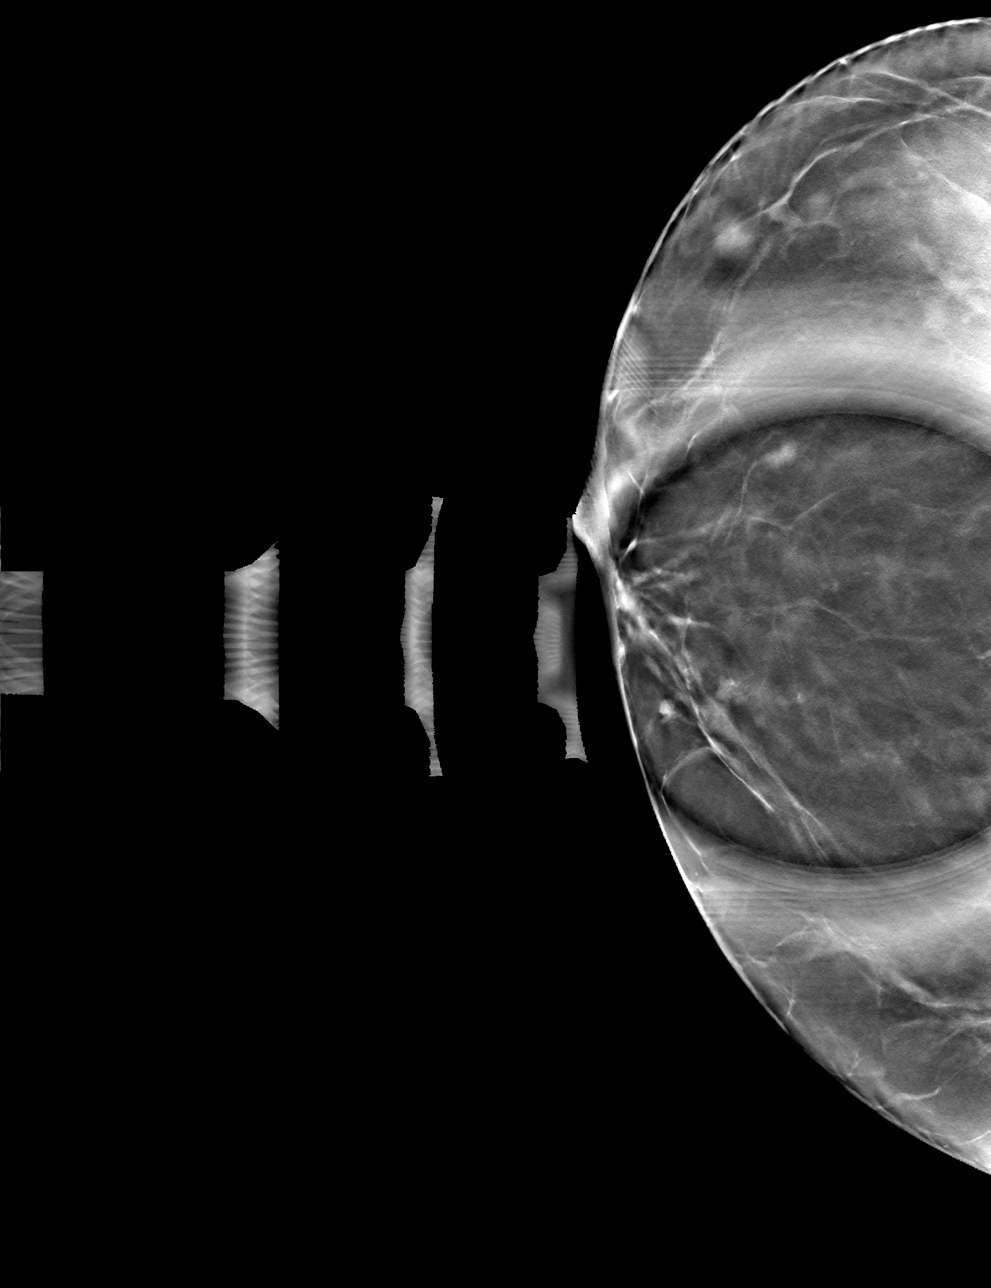

[4 of 12 positions shown; findings below may reference images not displayed]

ACR Breast Density Category b: There are scattered areas of
fibroglandular density.
FINDINGS: Mammogram:

Additional spot compression tomosynthesis views were performed of
the left breast for the questioned mass in the outer periareolar
region. On the additional imaging there is persistence of an oval
circumscribed mass measuring approximately 0.4 cm. No new findings
identified.

Ultrasound:

Targeted ultrasound is performed in the left breast at 3 o'clock 3
cm from the nipple demonstrating an oval circumscribed hypoechoic
mass measuring 0.4 x 0.5 x 0.5 cm. No internal blood flow. This
corresponds to the mammographic finding.
IMPRESSION: Left breast mass at 3 o'clock measuring 0.5 cm is probably benign
and most likely represents a complicated cyst.

RECOMMENDATION:
Left breast ultrasound in 6 months.

I have discussed the findings and recommendations with the patient.
If applicable, a reminder letter will be sent to the patient
regarding the next appointment.

BI-RADS CATEGORY  3: Probably benign.

## 2019-10-29 ENCOUNTER — Other Ambulatory Visit: Payer: Self-pay | Admitting: Obstetrics and Gynecology

## 2019-10-29 DIAGNOSIS — N6320 Unspecified lump in the left breast, unspecified quadrant: Secondary | ICD-10-CM

## 2020-02-04 ENCOUNTER — Other Ambulatory Visit: Payer: Self-pay | Admitting: Orthopedic Surgery

## 2020-02-04 DIAGNOSIS — S46311A Strain of muscle, fascia and tendon of triceps, right arm, initial encounter: Secondary | ICD-10-CM

## 2020-02-18 ENCOUNTER — Other Ambulatory Visit: Payer: Self-pay

## 2020-02-18 ENCOUNTER — Ambulatory Visit
Admission: RE | Admit: 2020-02-18 | Discharge: 2020-02-18 | Disposition: A | Discharge status: Home / Self Care | Payer: Managed Care, Other (non HMO) | Source: Ambulatory Visit | Attending: Orthopedic Surgery | Admitting: Orthopedic Surgery

## 2020-02-18 DIAGNOSIS — S46311A Strain of muscle, fascia and tendon of triceps, right arm, initial encounter: Secondary | ICD-10-CM | POA: Diagnosis present
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
## 2020-02-21 ENCOUNTER — Other Ambulatory Visit: Payer: Self-pay | Admitting: Surgery

## 2020-02-22 ENCOUNTER — Other Ambulatory Visit
Admission: RE | Admit: 2020-02-22 | Discharge: 2020-02-22 | Disposition: A | Discharge status: Home / Self Care | Payer: Managed Care, Other (non HMO) | Source: Ambulatory Visit | Attending: Orthopedic Surgery | Admitting: Orthopedic Surgery

## 2020-02-22 ENCOUNTER — Other Ambulatory Visit: Payer: Self-pay

## 2020-02-22 DIAGNOSIS — Z01812 Encounter for preprocedural laboratory examination: Secondary | ICD-10-CM | POA: Insufficient documentation

## 2020-02-22 HISTORY — DX: Family history of other specified conditions: Z84.89

## 2020-02-22 HISTORY — DX: Personal history of urinary calculi: Z87.442

## 2020-02-22 NOTE — Patient Instructions (Signed)
Your procedure is scheduled on: Tuesday 02/26/20.  Report to DAY SURGERY DEPARTMENT LOCATED ON 2ND FLOOR MEDICAL MALL ENTRANCE. To find out your arrival time please call [phone] between 1PM - 3PM on Monday 02/25/20.   Remember: Instructions that are not followed completely may result in serious medical risk, up to and including death, or upon the discretion of your surgeon and anesthesiologist your surgery may need to be rescheduled.     __X__ 1. Do not eat food after midnight the night before your procedure.                 No gum chewing or hard candies. You may drink clear liquids up to 2 hours                 before you are scheduled to arrive for your surgery- DO NOT drink clear                 liquids within 2 hours of the start of your surgery.                 Clear Liquids include:  water, apple juice without pulp, clear carbohydrate                 drink such as Clearfast or Gatorade, Black Coffee or Tea (Do not add                 milk or creamer to coffee or tea).   __X__2.  On the morning of surgery brush your teeth with toothpaste and water, you may rinse your mouth with mouthwash if you wish.  Do not swallow any toothpaste or mouthwash.    __X__ 3.  No Alcohol for 24 hours before or after surgery.    __X__ 4.  Do Not Smoke or use e-cigarettes For 24 Hours Prior to Your Surgery.                 Do not use any chewable tobacco products for at least 6 hours prior to                 Surgery.   __X__5.  Notify your doctor if there is any change in your medical condition      (cold, fever, infections).      Do NOT wear jewelry, make-up, hairpins, clips or nail polish. Do NOT wear lotions, powders, or perfumes.  Do NOT shave 48 hours prior to surgery. Men may shave face and neck. Do NOT bring valuables to the hospital.     [facility] is not responsible for any belongings or valuables.   Contacts, dentures/partials or body piercings may not be worn into surgery.  Bring a case for your contacts, glasses or hearing aids, a denture cup will be supplied.   For patients admitted to the hospital, discharge time is determined by your treatment team.    Patients discharged the day of surgery will not be allowed to drive home.     __X__ Take these medicines the morning of surgery with A SIP OF WATER:     1. mometasone (NASONEX) 50 MCG/ACT nasal spray  2. albuterol (VENTOLIN HFA)     __X__ Use SAGE wipes as directed  __X__ Use inhalers on the day of surgery. Also bring the inhaler with you to the hospital on the morning of surgery.  __X__ Stop Metformin 2 days prior to surgery.     __X__ Stop Anti-inflammatories 7 days before surgery  such as Advil, Ibuprofen, Motrin, BC or Goodies Powder, Naprosyn, Naproxen, Aleve, Aspirin, Meloxicam. May take Tylenol if needed for pain or discomfort.   __X__Do not start taking any new herbal supplements or vitamins prior to your procedure.   Wear comfortable clothing (specific to your surgery type) to the hospital.  Plan for stool softeners for home use; pain medications have a tendency to cause constipation. You can also help prevent constipation by eating foods high in fiber such as fruits and vegetables and drinking plenty of fluids as your diet allows.  After surgery, you can prevent lung complications by doing breathing exercises.Take deep breaths and cough every 1-2 hours. Your doctor may order a device called an Incentive Spirometer to help you take deep breaths.  Please call the Pre-Admissions Testing Department at [phone] if you have any questions about these instructions

## 2020-02-25 ENCOUNTER — Other Ambulatory Visit: Payer: Self-pay

## 2020-02-25 ENCOUNTER — Other Ambulatory Visit
Admission: RE | Admit: 2020-02-25 | Discharge: 2020-02-25 | Disposition: A | Discharge status: Home / Self Care | Payer: Managed Care, Other (non HMO) | Source: Ambulatory Visit | Attending: Orthopedic Surgery | Admitting: Orthopedic Surgery

## 2020-02-25 DIAGNOSIS — Z20822 Contact with and (suspected) exposure to covid-19: Secondary | ICD-10-CM | POA: Diagnosis not present

## 2020-02-25 DIAGNOSIS — Z01812 Encounter for preprocedural laboratory examination: Secondary | ICD-10-CM | POA: Insufficient documentation

## 2020-02-25 LAB — SARS CORONAVIRUS 2 (TAT 6-24 HRS): SARS Coronavirus 2: NEGATIVE

## 2020-02-25 MED ORDER — CLINDAMYCIN PHOSPHATE 900 MG/50ML IV SOLN
900.0000 mg | INTRAVENOUS | Status: AC
  Administered 2020-02-26: 900 mg via INTRAVENOUS

## 2020-02-25 MED ORDER — CHLORHEXIDINE GLUCONATE 0.12 % MT SOLN: 15.0000 mL | Freq: Once | OROMUCOSAL | Status: AC

## 2020-02-25 MED ORDER — ORAL CARE MOUTH RINSE: 15.0000 mL | Freq: Once | OROMUCOSAL | Status: AC

## 2020-02-25 MED ORDER — LACTATED RINGERS IV SOLN: INTRAVENOUS | Status: DC

## 2020-02-25 MED ORDER — FAMOTIDINE 20 MG PO TABS: 20.0000 mg | ORAL_TABLET | Freq: Once | ORAL | Status: AC

## 2020-02-26 ENCOUNTER — Encounter: Payer: Self-pay | Admitting: Surgery

## 2020-02-26 ENCOUNTER — Encounter
Admission: RE | Disposition: A | Discharge status: Home / Self Care | Payer: Self-pay | Source: Home / Self Care | Attending: Surgery

## 2020-02-26 ENCOUNTER — Ambulatory Visit: Payer: Managed Care, Other (non HMO) | Admitting: Anesthesiology

## 2020-02-26 ENCOUNTER — Ambulatory Visit
Admission: RE | Admit: 2020-02-26 | Discharge: 2020-02-26 | Disposition: A | Discharge status: Home / Self Care | Payer: Managed Care, Other (non HMO) | Attending: Surgery | Admitting: Surgery

## 2020-02-26 DIAGNOSIS — Z881 Allergy status to other antibiotic agents status: Secondary | ICD-10-CM | POA: Diagnosis not present

## 2020-02-26 DIAGNOSIS — F419 Anxiety disorder, unspecified: Secondary | ICD-10-CM | POA: Diagnosis not present

## 2020-02-26 DIAGNOSIS — Z885 Allergy status to narcotic agent status: Secondary | ICD-10-CM | POA: Diagnosis not present

## 2020-02-26 DIAGNOSIS — F329 Major depressive disorder, single episode, unspecified: Secondary | ICD-10-CM | POA: Insufficient documentation

## 2020-02-26 DIAGNOSIS — Z7984 Long term (current) use of oral hypoglycemic drugs: Secondary | ICD-10-CM | POA: Diagnosis not present

## 2020-02-26 DIAGNOSIS — F17210 Nicotine dependence, cigarettes, uncomplicated: Secondary | ICD-10-CM | POA: Insufficient documentation

## 2020-02-26 DIAGNOSIS — S46311A Strain of muscle, fascia and tendon of triceps, right arm, initial encounter: Secondary | ICD-10-CM | POA: Diagnosis present

## 2020-02-26 DIAGNOSIS — Z79899 Other long term (current) drug therapy: Secondary | ICD-10-CM | POA: Diagnosis not present

## 2020-02-26 DIAGNOSIS — Z882 Allergy status to sulfonamides status: Secondary | ICD-10-CM | POA: Diagnosis not present

## 2020-02-26 HISTORY — PX: TRICEPS TENDON REPAIR: SHX2577

## 2020-02-26 SURGERY — REPAIR, TENDON, TRICEPS
Anesthesia: General | Site: Elbow | Laterality: Right

## 2020-02-26 MED ORDER — FENTANYL CITRATE (PF) 100 MCG/2ML IJ SOLN
INTRAMUSCULAR | Status: AC
  Administered 2020-02-26: 25 ug via INTRAVENOUS
  Filled 2020-02-26: qty 2

## 2020-02-26 MED ORDER — GLYCOPYRROLATE 0.2 MG/ML IJ SOLN
INTRAMUSCULAR | Status: DC | PRN
  Administered 2020-02-26: .2 mg via INTRAVENOUS

## 2020-02-26 MED ORDER — PROPOFOL 10 MG/ML IV BOLUS
INTRAVENOUS | Status: AC
  Filled 2020-02-26: qty 40

## 2020-02-26 MED ORDER — ONDANSETRON HCL 4 MG PO TABS: 4.0000 mg | ORAL_TABLET | Freq: Four times a day (QID) | ORAL | Status: DC | PRN

## 2020-02-26 MED ORDER — MIDAZOLAM HCL 2 MG/2ML IJ SOLN
INTRAMUSCULAR | Status: AC
  Filled 2020-02-26: qty 2

## 2020-02-26 MED ORDER — FAMOTIDINE 20 MG PO TABS
ORAL_TABLET | ORAL | Status: AC
  Administered 2020-02-26: 20 mg via ORAL
  Filled 2020-02-26: qty 1

## 2020-02-26 MED ORDER — MIDAZOLAM HCL 2 MG/2ML IJ SOLN
INTRAMUSCULAR | Status: DC | PRN
  Administered 2020-02-26: 2 mg via INTRAVENOUS

## 2020-02-26 MED ORDER — METOCLOPRAMIDE HCL 5 MG/ML IJ SOLN
5.0000 mg | Freq: Three times a day (TID) | INTRAMUSCULAR | Status: DC | PRN
  Administered 2020-02-26: 10 mg via INTRAVENOUS

## 2020-02-26 MED ORDER — SODIUM CHLORIDE 0.9 % IV SOLN: INTRAVENOUS | Status: DC | PRN

## 2020-02-26 MED ORDER — FENTANYL CITRATE (PF) 100 MCG/2ML IJ SOLN
INTRAMUSCULAR | Status: AC
  Filled 2020-02-26: qty 2

## 2020-02-26 MED ORDER — CHLORHEXIDINE GLUCONATE 0.12 % MT SOLN
OROMUCOSAL | Status: AC
  Administered 2020-02-26: 15 mL via OROMUCOSAL
  Filled 2020-02-26: qty 15

## 2020-02-26 MED ORDER — ONDANSETRON 4 MG PO TBDP
4.0000 mg | ORAL_TABLET | Freq: Three times a day (TID) | ORAL | 1 refills | Status: AC | PRN
Start: 2020-02-26 — End: ?

## 2020-02-26 MED ORDER — ONDANSETRON HCL 4 MG/2ML IJ SOLN: 4.0000 mg | Freq: Once | INTRAMUSCULAR | Status: DC | PRN

## 2020-02-26 MED ORDER — OXYCODONE HCL 5 MG PO TABS: 5.0000 mg | ORAL_TABLET | ORAL | Status: DC | PRN

## 2020-02-26 MED ORDER — METOCLOPRAMIDE HCL 5 MG/ML IJ SOLN
INTRAMUSCULAR | Status: AC
  Filled 2020-02-26: qty 2

## 2020-02-26 MED ORDER — ACETAMINOPHEN 10 MG/ML IV SOLN
INTRAVENOUS | Status: DC | PRN
  Administered 2020-02-26: 1000 mg via INTRAVENOUS

## 2020-02-26 MED ORDER — DEXAMETHASONE SODIUM PHOSPHATE 10 MG/ML IJ SOLN
INTRAMUSCULAR | Status: DC | PRN
  Administered 2020-02-26: 10 mg via INTRAVENOUS

## 2020-02-26 MED ORDER — METOCLOPRAMIDE HCL 10 MG PO TABS: 5.0000 mg | ORAL_TABLET | Freq: Three times a day (TID) | ORAL | Status: DC | PRN

## 2020-02-26 MED ORDER — OXYCODONE HCL 5 MG PO TABS: 5.0000 mg | ORAL_TABLET | ORAL | 0 refills | Status: AC | PRN

## 2020-02-26 MED ORDER — SUGAMMADEX SODIUM 200 MG/2ML IV SOLN
INTRAVENOUS | Status: DC | PRN
  Administered 2020-02-26: 200 mg via INTRAVENOUS

## 2020-02-26 MED ORDER — DEXMEDETOMIDINE HCL 200 MCG/2ML IV SOLN
INTRAVENOUS | Status: DC | PRN
  Administered 2020-02-26 (×5): 4 ug via INTRAVENOUS

## 2020-02-26 MED ORDER — PHENYLEPHRINE HCL (PRESSORS) 10 MG/ML IV SOLN
INTRAVENOUS | Status: DC | PRN
  Administered 2020-02-26 (×2): 100 ug via INTRAVENOUS

## 2020-02-26 MED ORDER — ONDANSETRON HCL 4 MG/2ML IJ SOLN
INTRAMUSCULAR | Status: AC
  Filled 2020-02-26: qty 2

## 2020-02-26 MED ORDER — ONDANSETRON HCL 4 MG/2ML IJ SOLN: 4.0000 mg | Freq: Four times a day (QID) | INTRAMUSCULAR | Status: DC | PRN

## 2020-02-26 MED ORDER — FENTANYL CITRATE (PF) 100 MCG/2ML IJ SOLN
25.0000 ug | INTRAMUSCULAR | Status: DC | PRN
  Administered 2020-02-26 (×4): 25 ug via INTRAVENOUS

## 2020-02-26 MED ORDER — SODIUM CHLORIDE 0.9 % IV SOLN
INTRAVENOUS | Status: DC | PRN
  Administered 2020-02-26: 20 ug/min via INTRAVENOUS

## 2020-02-26 MED ORDER — ROCURONIUM BROMIDE 10 MG/ML (PF) SYRINGE
PREFILLED_SYRINGE | INTRAVENOUS | Status: AC
  Filled 2020-02-26: qty 10

## 2020-02-26 MED ORDER — LIDOCAINE HCL (CARDIAC) PF 100 MG/5ML IV SOSY
PREFILLED_SYRINGE | INTRAVENOUS | Status: DC | PRN
  Administered 2020-02-26: 100 mg via INTRAVENOUS

## 2020-02-26 MED ORDER — FENTANYL CITRATE (PF) 100 MCG/2ML IJ SOLN
INTRAMUSCULAR | Status: DC | PRN
  Administered 2020-02-26: 25 ug via INTRAVENOUS
  Administered 2020-02-26 (×2): 50 ug via INTRAVENOUS
  Administered 2020-02-26: 25 ug via INTRAVENOUS

## 2020-02-26 MED ORDER — LIDOCAINE HCL (PF) 2 % IJ SOLN
INTRAMUSCULAR | Status: AC
  Filled 2020-02-26: qty 10

## 2020-02-26 MED ORDER — PROPOFOL 10 MG/ML IV BOLUS
INTRAVENOUS | Status: DC | PRN
  Administered 2020-02-26: 150 mg via INTRAVENOUS

## 2020-02-26 MED ORDER — GLYCOPYRROLATE 0.2 MG/ML IJ SOLN
INTRAMUSCULAR | Status: AC
  Filled 2020-02-26: qty 1

## 2020-02-26 MED ORDER — ACETAMINOPHEN 10 MG/ML IV SOLN
INTRAVENOUS | Status: AC
  Filled 2020-02-26: qty 100

## 2020-02-26 MED ORDER — SUCCINYLCHOLINE CHLORIDE 200 MG/10ML IV SOSY
PREFILLED_SYRINGE | INTRAVENOUS | Status: AC
  Filled 2020-02-26: qty 10

## 2020-02-26 MED ORDER — DEXAMETHASONE SODIUM PHOSPHATE 10 MG/ML IJ SOLN
INTRAMUSCULAR | Status: AC
  Filled 2020-02-26: qty 1

## 2020-02-26 MED ORDER — OXYCODONE HCL 5 MG PO TABS
ORAL_TABLET | ORAL | Status: AC
  Administered 2020-02-26: 10 mg via ORAL
  Filled 2020-02-26: qty 2

## 2020-02-26 MED ORDER — ONDANSETRON HCL 4 MG/2ML IJ SOLN
INTRAMUSCULAR | Status: DC | PRN
  Administered 2020-02-26: 4 mg via INTRAVENOUS

## 2020-02-26 MED ORDER — BUPIVACAINE HCL (PF) 0.5 % IJ SOLN
INTRAMUSCULAR | Status: DC | PRN
  Administered 2020-02-26: 10 mL

## 2020-02-26 MED ORDER — CLINDAMYCIN PHOSPHATE 900 MG/50ML IV SOLN
INTRAVENOUS | Status: AC
  Filled 2020-02-26: qty 50

## 2020-02-26 MED ORDER — POTASSIUM CHLORIDE IN NACL 20-0.9 MEQ/L-% IV SOLN
INTRAVENOUS | Status: DC
  Filled 2020-02-26: qty 1000

## 2020-02-26 MED ORDER — ROCURONIUM BROMIDE 100 MG/10ML IV SOLN
INTRAVENOUS | Status: DC | PRN
  Administered 2020-02-26 (×2): 20 mg via INTRAVENOUS
  Administered 2020-02-26: 10 mg via INTRAVENOUS
  Administered 2020-02-26: 40 mg via INTRAVENOUS

## 2020-02-26 SURGICAL SUPPLY — 61 items
ANCH SUT KNTLS STRL SHLDR SYS (Anchor) ×1 IMPLANT
ANCH SUT Q-FX 2.8 (Anchor) ×2 IMPLANT
ANCHOR ALL-SUT Q-FIX 2.8 (Anchor) ×2 IMPLANT
ANCHOR SUT QUATTRO KNTLS 4.5 (Anchor) ×1 IMPLANT
APL PRP STRL LF DISP 70% ISPRP (MISCELLANEOUS) ×1
BLADE SURG SZ10 CARB STEEL (BLADE) ×4 IMPLANT
BNDG COHESIVE 4X5 TAN STRL (GAUZE/BANDAGES/DRESSINGS) ×3 IMPLANT
BNDG ESMARK 4X12 TAN STRL LF (GAUZE/BANDAGES/DRESSINGS) ×2 IMPLANT
BUR 4.8X51.2 (BURR) ×2 IMPLANT
CANISTER SUCT 1200ML W/VALVE (MISCELLANEOUS) ×2 IMPLANT
CHLORAPREP W/TINT 26 (MISCELLANEOUS) ×2 IMPLANT
COVER WAND RF STERILE (DRAPES) ×2 IMPLANT
CUFF DUAL TOURNIQUET 18IN DISP (TOURNIQUET CUFF) IMPLANT
CUFF TOURN 24 STER (MISCELLANEOUS) ×1 IMPLANT
ELECT REM PT RETURN 9FT ADLT (ELECTROSURGICAL) ×2
ELECTRODE REM PT RTRN 9FT ADLT (ELECTROSURGICAL) ×1 IMPLANT
GAUZE SPONGE 4X4 12PLY STRL (GAUZE/BANDAGES/DRESSINGS) ×2 IMPLANT
GAUZE XEROFORM 1X8 LF (GAUZE/BANDAGES/DRESSINGS) ×2 IMPLANT
GLOVE BIO SURGEON STRL SZ8 (GLOVE) ×4 IMPLANT
GLOVE INDICATOR 8.0 STRL GRN (GLOVE) ×2 IMPLANT
GOWN STRL REUS W/ TWL LRG LVL3 (GOWN DISPOSABLE) ×1 IMPLANT
GOWN STRL REUS W/ TWL XL LVL3 (GOWN DISPOSABLE) ×1 IMPLANT
GOWN STRL REUS W/TWL LRG LVL3 (GOWN DISPOSABLE) ×2
GOWN STRL REUS W/TWL XL LVL3 (GOWN DISPOSABLE) ×2
KIT SUTURE 2.8 Q-FIX DISP (MISCELLANEOUS) ×1 IMPLANT
KIT TURNOVER KIT A (KITS) ×2 IMPLANT
LOOP RED MAXI  1X406MM (MISCELLANEOUS)
LOOP VESSEL MAXI  1X406 RED (MISCELLANEOUS)
LOOP VESSEL MAXI 1X406 RED (MISCELLANEOUS) ×1 IMPLANT
NDL FILTER BLUNT 18X1 1/2 (NEEDLE) ×1 IMPLANT
NDL MAYO CATGUT SZ5 (NEEDLE) ×2
NDL SUT 5 .5 CRC TPR PNT MAYO (NEEDLE) ×1 IMPLANT
NEEDLE FILTER BLUNT 18X 1/2SAF (NEEDLE)
NEEDLE FILTER BLUNT 18X1 1/2 (NEEDLE) IMPLANT
NS IRRIG 500ML POUR BTL (IV SOLUTION) ×2 IMPLANT
PACK EXTREMITY (MISCELLANEOUS) ×2 IMPLANT
PAD ABD DERMACEA PRESS 5X9 (GAUZE/BANDAGES/DRESSINGS) ×2 IMPLANT
PADDING CAST 4IN STRL (MISCELLANEOUS) ×1
PADDING CAST BLEND 4X4 NS (MISCELLANEOUS) ×2 IMPLANT
PADDING CAST BLEND 4X4 STRL (MISCELLANEOUS) IMPLANT
PASSER SUT FIRSTPASS SELF (INSTRUMENTS) ×1 IMPLANT
PASSER SUT SWANSON 36MM LOOP (INSTRUMENTS) IMPLANT
RETRIEVER SUT LRG (INSTRUMENTS) IMPLANT
SLING ARM LRG DEEP (SOFTGOODS) ×2 IMPLANT
SLING ARM M TX990204 (SOFTGOODS) ×1 IMPLANT
SPLINT CAST 1 STEP 4X30 (MISCELLANEOUS) ×2 IMPLANT
SPONGE LAP 18X18 RF (DISPOSABLE) ×2 IMPLANT
STAPLER SKIN PROX 35W (STAPLE) ×1 IMPLANT
STOCKINETTE IMPERVIOUS 9X36 MD (GAUZE/BANDAGES/DRESSINGS) ×2 IMPLANT
STRIP CLOSURE SKIN 1/4X4 (GAUZE/BANDAGES/DRESSINGS) ×1 IMPLANT
SUT FIBERWIRE #2 38 BLUE 1/2 (SUTURE) ×2
SUT FIBERWIRE #2 38 T-5 BLUE (SUTURE)
SUT FIBERWIRE #5 38 BLUE (WIRE) ×2 IMPLANT
SUT RETRIEVER MED (INSTRUMENTS) IMPLANT
SUT RETRIEVER SML (INSTRUMENTS) IMPLANT
SUT VIC AB 2-0 CT1 27 (SUTURE) ×4
SUT VIC AB 2-0 CT1 TAPERPNT 27 (SUTURE) ×2 IMPLANT
SUTURE FIBERWR #2 38 BLUE 1/2 (SUTURE) IMPLANT
SUTURE FIBERWR #2 38 T-5 BLUE (SUTURE) ×1 IMPLANT
SWABSTK COMLB BENZOIN TINCTURE (MISCELLANEOUS) ×1 IMPLANT
SYR 5ML LL (SYRINGE) ×1 IMPLANT

## 2020-02-26 NOTE — Anesthesia Procedure Notes (Signed)
Procedure Name: Intubation Date/Time: 02/26/2020 1:35 PM Performed by: Joanette Gula, Summer, RN Pre-anesthesia Checklist: Patient identified, Emergency Drugs available, Suction available and Patient being monitored Patient Re-evaluated:Patient Re-evaluated prior to induction Oxygen Delivery Method: Circle system utilized Preoxygenation: Pre-oxygenation with 100% oxygen Induction Type: IV induction Ventilation: Mask ventilation without difficulty Laryngoscope Size: McGraph and 3 Grade View: Grade I Tube type: Oral Number of attempts: 1 Airway Equipment and Method: Stylet Placement Confirmation: ETT inserted through vocal cords under direct vision,  positive ETCO2 and breath sounds checked- equal and bilateral Secured at: 20 cm Tube secured with: Tape Dental Injury: Teeth and Oropharynx as per pre-operative assessment

## 2020-02-26 NOTE — Transfer of Care (Signed)
Immediate Anesthesia Transfer of Care Note  Patient: Isabel Hines  Procedure(s) Performed: TRICEPS TENDON REPAIR (Right Elbow)  Patient Location: PACU  Anesthesia Type:General  Level of Consciousness: drowsy and patient cooperative  Airway & Oxygen Therapy: Patient Spontanous Breathing and Patient connected to face mask oxygen  Post-op Assessment: Report given to RN and Post -op Vital signs reviewed and stable  Post vital signs: Reviewed and stable  Last Vitals:  Vitals Value Taken Time  BP 138/60 02/26/20 1526  Temp    Pulse 71 02/26/20 1529  Resp 19 02/26/20 1529  SpO2 100 % 02/26/20 1529  Vitals shown include unvalidated device data.  Last Pain:  Vitals:   02/26/20 1112  TempSrc: Tympanic         Complications: No complications documented.

## 2020-02-26 NOTE — Discharge Instructions (Addendum)
Orthopedic discharge instructions: Keep splint dry and intact. Keep hand elevated above heart level. Apply ice to affected area frequently. Take ibuprofen 600-800 mg TID with meals for 7-10 days, then as necessary.    TID = 3 times per day (every 8 hours) Take pain medication as prescribed or ES Tylenol when needed.  Return for follow-up in 10-14 days or as scheduled.  AMBULATORY SURGERY  DISCHARGE INSTRUCTIONS   1) The drugs that you were given will stay in your system until tomorrow so for the next 24 hours you should not:  A) Drive an automobile B) Make any legal decisions C) Drink any alcoholic beverage   2) You may resume regular meals tomorrow.  Today it is better to start with liquids and gradually work up to solid foods.  You may eat anything you prefer, but it is better to start with liquids, then soup and crackers, and gradually work up to solid foods.   3) Please notify your doctor immediately if you have any unusual bleeding, trouble breathing, redness and pain at the surgery site, drainage, fever, or pain not relieved by medication.    4) Additional Instructions:        Please contact your physician with any problems or Same Day Surgery at [phone], Monday through Friday 6 am to 4 pm, or [facility] number at [phone]. 
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
## 2020-02-26 NOTE — Anesthesia Preprocedure Evaluation (Signed)
Anesthesia Evaluation  Patient identified by MRN, date of birth, ID band Patient awake    Reviewed: Allergy & Precautions, NPO status , Patient's Chart, lab work & pertinent test results  History of Anesthesia Complications (+) Family history of anesthesia reaction  Airway Mallampati: II  TM Distance: <3 FB     Dental   Pulmonary Current Smoker,    Pulmonary exam normal        Cardiovascular negative cardio ROS Normal cardiovascular exam     Neuro/Psych PSYCHIATRIC DISORDERS Anxiety Depression    GI/Hepatic Neg liver ROS, GERD  ,  Endo/Other    Renal/GU negative Renal ROS  negative genitourinary   Musculoskeletal   Abdominal Normal abdominal exam  (+)   Peds negative pediatric ROS (+)  Hematology negative hematology ROS (+)   Anesthesia Other Findings   Reproductive/Obstetrics                             Anesthesia Physical Anesthesia Plan  ASA: II  Anesthesia Plan: General   Post-op Pain Management:    Induction: Intravenous  PONV Risk Score and Plan:   Airway Management Planned: Oral ETT  Additional Equipment:   Intra-op Plan:   Post-operative Plan: Extubation in OR  Informed Consent: I have reviewed the patients History and Physical, chart, labs and discussed the procedure including the risks, benefits and alternatives for the proposed anesthesia with the patient or authorized representative who has indicated his/her understanding and acceptance.     Dental advisory given  Plan Discussed with: CRNA and Surgeon  Anesthesia Plan Comments:         Anesthesia Quick Evaluation
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
## 2020-02-27 ENCOUNTER — Encounter: Payer: Self-pay | Admitting: Surgery

## 2020-02-28 NOTE — Anesthesia Postprocedure Evaluation (Signed)
Anesthesia Post Note  Patient: Isabel Hines  Procedure(s) Performed: TRICEPS TENDON REPAIR (Right Elbow)  Patient location during evaluation: PACU Anesthesia Type: General Level of consciousness: awake and alert Pain management: pain level controlled Vital Signs Assessment: post-procedure vital signs reviewed and stable Respiratory status: spontaneous breathing, nonlabored ventilation, respiratory function stable and patient connected to nasal cannula oxygen Cardiovascular status: blood pressure returned to baseline and stable Postop Assessment: no apparent nausea or vomiting Anesthetic complications: no   No complications documented.   Last Vitals:  Vitals:   02/26/20 1705 02/26/20 1733  BP: (!) 156/75 (!) 159/75  Pulse: 72 67  Resp: 16 16  Temp: 36.5 C   SpO2: 97% 97%    Last Pain:  Vitals:   02/26/20 1733  TempSrc:   PainSc: 6                  Lenard Simmer

## 2020-04-16 ENCOUNTER — Encounter: Payer: Self-pay | Admitting: Nurse Practitioner

## 2020-04-16 ENCOUNTER — Ambulatory Visit: Payer: Managed Care, Other (non HMO) | Admitting: Nurse Practitioner

## 2020-04-16 ENCOUNTER — Other Ambulatory Visit: Payer: Self-pay

## 2020-04-16 VITALS — BP 113/79 | HR 70 | Temp 98.0°F | Resp 16 | Ht 66.0 in | Wt 169.0 lb

## 2020-04-16 DIAGNOSIS — Z0000 Encounter for general adult medical examination without abnormal findings: Secondary | ICD-10-CM | POA: Diagnosis not present

## 2020-04-16 DIAGNOSIS — Z008 Encounter for other general examination: Secondary | ICD-10-CM | POA: Diagnosis not present

## 2020-04-16 NOTE — Patient Instructions (Signed)
Immunization Schedule, 35-52 Years Old  Vaccines are usually given at various ages, according to a schedule. Your health care provider will recommend vaccines for you based on your age, medical history, and lifestyle or other factors such as travel or where you work. You may receive vaccines as individual doses or as more than one vaccine together in one shot (combination vaccines). Talk with your health care provider about the risks and benefits of combination vaccines. Recommended immunizations for 110-1 years old Influenza vaccine  You should get a dose of the influenza vaccine every year. Tetanus, diphtheria, and pertussis vaccine A vaccine that protects against tetanus, diphtheria, and pertussis is known as the Tdap vaccine. A vaccine that protects against tetanus and diphtheria is known as the Td vaccine.  You should only get the Td vaccine if you have had at least 1 dose of the Tdap vaccine.  You should get 1 dose of the Td or Tdap vaccine every 10 years, or you should get 1 dose of the Tdap vaccine if: ? You have not previously gotten a Tdap vaccine. ? You do not know if you have ever gotten a Tdap vaccine. Zoster vaccine This is also known as the RZV vaccine. You should get 2 doses of the RZV vaccine 2 to 6 months apart. It is important to get the RZV vaccine even if you:  Have had shingles.  Have received the ZVL vaccine, an older version of the RZV vaccine.  Are unsure if you have had chickenpox (varicella). Pneumococcal conjugate vaccine This is also known as the PCV13 vaccine. You should get the PCV13 vaccine as recommended if you have certain high-risk conditions. These include:  Diabetes.  Chronic conditions of the heart, lungs, or liver.  Conditions that affect the body's disease-fighting system (immune system). Pneumococcal polysaccharide vaccine This is also known as the PPSV23 vaccine. You should get the PPSV23 vaccine as recommended if you have certain high-risk  conditions. These include:  Diabetes.  Chronic conditions of the heart, lungs, or liver.  Conditions that affect the immune system. Hepatitis A vaccine This is also known as the HepA vaccine. If you did not get the HepA vaccine previously, you should get it if:  You are at risk for a hepatitis A infection. You may be at risk for infection if you: ? Have chronic liver disease. ? Have HIV or AIDS. ? Are a man who has sex with men. ? Use drugs. ? Are homeless. ? May be exposed to hepatitis A through work. ? Travel to countries where hepatitis A is common. ? Have or will have close contact with someone who was adopted from another country.  You are not at risk for infection but want protection from hepatitis A. Hepatitis B vaccine This is also known as the HepB vaccine. If you did not get the HepB vaccine previously, you should get it if:  You are at risk for hepatitis B infection. You are at risk if you: ? Have chronic liver disease. ? Have HIV or AIDS. ? Have sex with a partner who has hepatitis B, or:  You have multiple sex partners.  You are a man who has sex with men. ? Use drugs. ? May be exposed to hepatitis B through work. ? Live with someone who has hepatitis B. ? Receive dialysis treatment. ? Have diabetes. ? Travel to countries where hepatitis B is common.  You are not at risk of infection but want protection from hepatitis B. Measles, mumps, and  rubella vaccine This is also known as the MMR vaccine. You may need to get the MMR vaccine if you were born in 1957 or later and:  You need to catch up on doses you missed in the past.  You have not been given the vaccine before.  You do not have evidence of immunity (by a blood test). Varicella vaccine This is also known as the VAR vaccine. You may need to get the VAR vaccine if you do not have evidence of immunity (by a blood test) and you may be exposed to varicella through work. This is especially important if you  work in a health care setting. Meningococcal conjugate vaccine This is also known as the MenACWY vaccine. You may need to get the MenACWY vaccine if you:  Have not been given the vaccine before.  Need to catch up on doses you missed in the past. This vaccine is especially important if you:  Do not have a spleen.  Have sickle cell disease.  Have HIV.  Take medicines that suppress your immune system.  Travel to countries where meningococcal disease is common.  Are exposed to Neisseria meningitidis at work. Serogroup B meningococcal vaccine This is also known as the MenB vaccine. You may need to get the MenB vaccine if you:  Have not been given the vaccine before.  Need to catch up on doses you missed in the past. This vaccine is especially important if you:  Do not have a spleen.  Have sickle cell disease.  Take medicines that suppress your immune system.  Are exposed to Neisseria meningitidis at work. Haemophilus influenzae type b vaccine This is also known as the Hib vaccine. Anyone older than 52 years of age is usually not given the Hib vaccine. However, if you have certain high-risk conditions, you may need to get this vaccine. These conditions include:  Not having a spleen.  Having received a stem cell transplant. Before you get a vaccine: Talk with your health care provider about which vaccines are right for you. This is especially important if:  You previously had a reaction after getting a vaccine.  You have a weakened immune system. You may have a weakened immune system if you: ? Are taking medicines that reduce (suppress) the activity of your immune system. ? Are taking medicines to treat cancer (chemotherapy). ? Have HIV or AIDS.  You work in an environment where you may be exposed to a disease.  You plan to travel outside of the country.  You have a chronic illness, such as heart disease, kidney disease, diabetes, or lung disease. Summary  Before you  get a vaccine, tell your health care provider if you have reacted to vaccines in the past or have a condition that weakens your immune system.  At 50-64 years, you should get a dose of the flu vaccine every year and a dose of the Td or Tdap vaccine every 10 years.  You should get 2 doses of the RZV vaccine 2 to 6 months apart.  Depending on your medical history and your risk factors, you may need other vaccines. Ask your health care provider whether you are up to date on all your vaccines. This information is not intended to replace advice given to you by your health care provider. Make sure you discuss any questions you have with your health care provider. Document Revised: 05/08/2019 Document Reviewed: 05/08/2019 Elsevier Patient Education  [place] Maintenance, Female Adopting a healthy lifestyle and  getting preventive care are important in promoting health and wellness. Ask your health care provider about:  The right schedule for you to have regular tests and exams.  Things you can do on your own to prevent diseases and keep yourself healthy. What should I know about diet, weight, and exercise? Eat a healthy diet   Eat a diet that includes plenty of vegetables, fruits, low-fat dairy products, and lean protein.  Do not eat a lot of foods that are high in solid fats, added sugars, or sodium. Maintain a healthy weight Body mass index (BMI) is used to identify weight problems. It estimates body fat based on height and weight. Your health care provider can help determine your BMI and help you achieve or maintain a healthy weight. Get regular exercise Get regular exercise. This is one of the most important things you can do for your health. Most adults should:  Exercise for at least 150 minutes each week. The exercise should increase your heart rate and make you sweat (moderate-intensity exercise).  Do strengthening exercises at least twice a week. This is in addition  to the moderate-intensity exercise.  Spend less time sitting. Even light physical activity can be beneficial. Watch cholesterol and blood lipids Have your blood tested for lipids and cholesterol at 52 years of age, then have this test every 5 years. Have your cholesterol levels checked more often if:  Your lipid or cholesterol levels are high.  You are older than 52 years of age.  You are at high risk for heart disease. What should I know about cancer screening? Depending on your health history and family history, you may need to have cancer screening at various ages. This may include screening for:  Breast cancer.  Cervical cancer.  Colorectal cancer.  Skin cancer.  Lung cancer. What should I know about heart disease, diabetes, and high blood pressure? Blood pressure and heart disease  High blood pressure causes heart disease and increases the risk of stroke. This is more likely to develop in people who have high blood pressure readings, are of African descent, or are overweight.  Have your blood pressure checked: ? Every 3-5 years if you are 71-64 years of age. ? Every year if you are 61 years old or older. Diabetes Have regular diabetes screenings. This checks your fasting blood sugar level. Have the screening done:  Once every three years after age 46 if you are at a normal weight and have a low risk for diabetes.  More often and at a younger age if you are overweight or have a high risk for diabetes. What should I know about preventing infection? Hepatitis B If you have a higher risk for hepatitis B, you should be screened for this virus. Talk with your health care provider to find out if you are at risk for hepatitis B infection. Hepatitis C Testing is recommended for:  Everyone born from [birth year] through 1963.  Anyone with known risk factors for hepatitis C. Sexually transmitted infections (STIs)  Get screened for STIs, including gonorrhea and chlamydia, if: ? You  are sexually active and are younger than 52 years of age. ? You are older than 52 years of age and your health care provider tells you that you are at risk for this type of infection. ? Your sexual activity has changed since you were last screened, and you are at increased risk for chlamydia or gonorrhea. Ask your health care provider if you are at risk.  Ask your  health care provider about whether you are at high risk for HIV. Your health care provider may recommend a prescription medicine to help prevent HIV infection. If you choose to take medicine to prevent HIV, you should first get tested for HIV. You should then be tested every 3 months for as long as you are taking the medicine. Pregnancy  If you are about to stop having your period (premenopausal) and you may become pregnant, seek counseling before you get pregnant.  Take 400 to 800 micrograms (mcg) of folic acid every day if you become pregnant.  Ask for birth control (contraception) if you want to prevent pregnancy. Osteoporosis and menopause Osteoporosis is a disease in which the bones lose minerals and strength with aging. This can result in bone fractures. If you are 15 years old or older, or if you are at risk for osteoporosis and fractures, ask your health care provider if you should:  Be screened for bone loss.  Take a calcium or vitamin D supplement to lower your risk of fractures.  Be given hormone replacement therapy (HRT) to treat symptoms of menopause. Follow these instructions at home: Lifestyle  Do not use any products that contain nicotine or tobacco, such as cigarettes, e-cigarettes, and chewing tobacco. If you need help quitting, ask your health care provider.  Do not use street drugs.  Do not share needles.  Ask your health care provider for help if you need support or information about quitting drugs. Alcohol use  Do not drink alcohol if: ? Your health care provider tells you not to drink. ? You are  pregnant, may be pregnant, or are planning to become pregnant.  If you drink alcohol: ? Limit how much you use to 0-1 drink a day. ? Limit intake if you are breastfeeding.  Be aware of how much alcohol is in your drink. In the [place] one drink equals one 12 oz bottle of beer (355 mL), one 5 oz glass of wine (148 mL), or one 1 oz glass of hard liquor (44 mL). General instructions  Schedule regular health, dental, and eye exams.  Stay current with your vaccines.  Tell your health care provider if: ? You often feel depressed. ? You have ever been abused or do not feel safe at home. Summary  Adopting a healthy lifestyle and getting preventive care are important in promoting health and wellness.  Follow your health care provider's instructions about healthy diet, exercising, and getting tested or screened for diseases.  Follow your health care provider's instructions on monitoring your cholesterol and blood pressure. This information is not intended to replace advice given to you by your health care provider. Make sure you discuss any questions you have with your health care provider. Document Revised: 07/05/2018 Document Reviewed: 07/05/2018 Elsevier Patient Education  2020 [employer].
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
## 2020-04-17 LAB — LIPID PANEL
Chol/HDL Ratio: 4.3 ratio (ref 0.0–4.4)
Cholesterol, Total: 164 mg/dL (ref 100–199)
HDL: 38 mg/dL — ABNORMAL LOW (ref >39)
LDL Chol Calc (NIH): 90 mg/dL (ref 0–99)
Triglycerides: 212 mg/dL — ABNORMAL HIGH (ref 0–149)
VLDL Cholesterol Cal: 36 mg/dL (ref 5–40)

## 2020-04-17 LAB — GLUCOSE, RANDOM: Glucose: 93 mg/dL (ref 65–99)

## 2020-07-01 ENCOUNTER — Other Ambulatory Visit: Payer: Self-pay | Admitting: Surgery

## 2020-07-01 DIAGNOSIS — M25871 Other specified joint disorders, right ankle and foot: Secondary | ICD-10-CM

## 2020-07-01 DIAGNOSIS — S8261XA Displaced fracture of lateral malleolus of right fibula, initial encounter for closed fracture: Secondary | ICD-10-CM

## 2020-07-04 ENCOUNTER — Other Ambulatory Visit: Payer: Self-pay

## 2020-07-04 ENCOUNTER — Ambulatory Visit
Admission: RE | Admit: 2020-07-04 | Discharge: 2020-07-04 | Disposition: A | Discharge status: Home / Self Care | Payer: Managed Care, Other (non HMO) | Source: Ambulatory Visit | Attending: Surgery | Admitting: Surgery

## 2020-07-04 DIAGNOSIS — S8261XA Displaced fracture of lateral malleolus of right fibula, initial encounter for closed fracture: Secondary | ICD-10-CM | POA: Diagnosis present

## 2020-07-04 DIAGNOSIS — M25871 Other specified joint disorders, right ankle and foot: Secondary | ICD-10-CM | POA: Diagnosis present

## 2020-07-04 IMAGING — MR MR ANKLE*R* W/O CM
5 series · 40 of 40 positions shown · non-contrast
Comparison: None.

CLINICAL DATA: Lateral ankle swelling, limited range of motion

EXAM:
MRI OF THE RIGHT ANKLE WITHOUT CONTRAST
TECHNIQUE: Multiplanar, multisequence MR imaging of the ankle was performed. No
intravenous contrast was administered.

[Series 3: PD fat-sat · axial · right · 3.0mm · 0.50mm/px · z∈[-64,+76]mm · 10 of 36 slices shown]
[im 1/36]
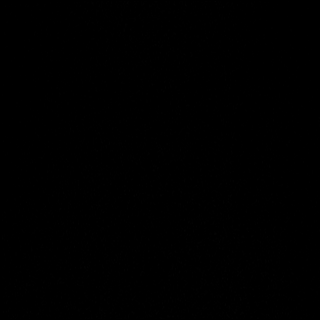
[im 4/36]
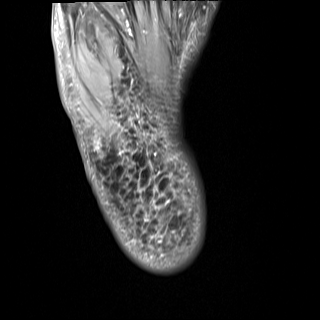
[im 8/36]
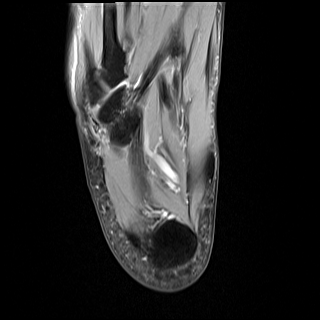
[im 12/36]
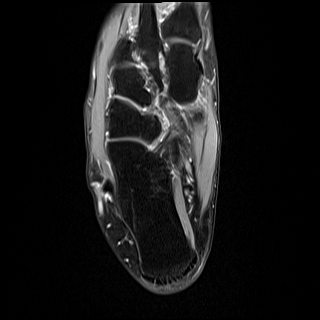
[im 16/36]
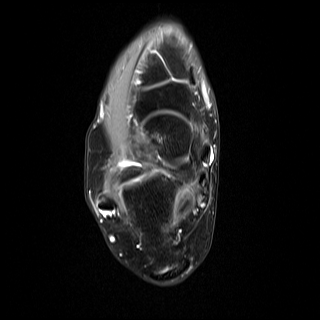
[im 20/36]
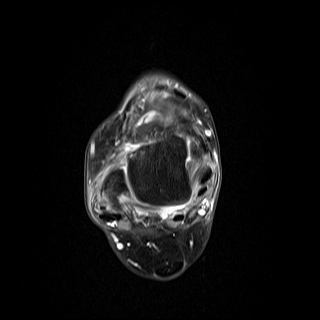
[im 24/36]
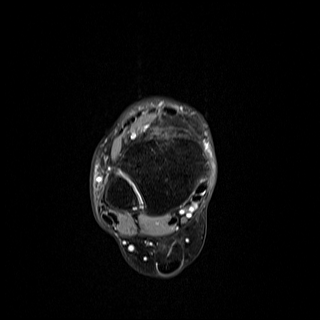
[im 28/36]
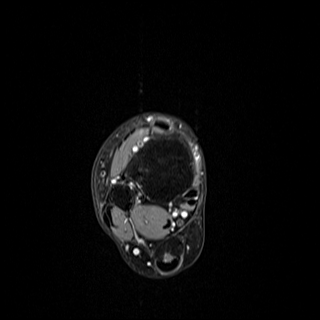
[im 32/36]
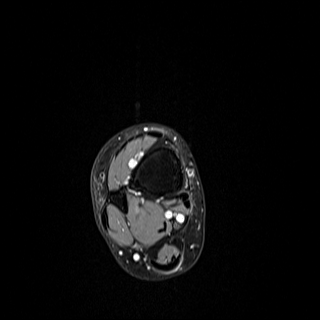
[im 36/36]
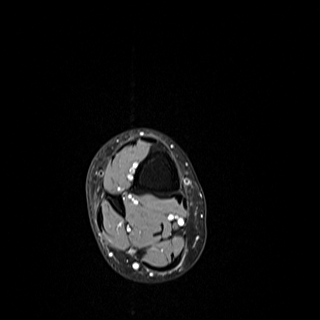

[Series 4: T2 fat-sat · axial · right · 3.0mm · 0.50mm/px · z∈[-64,+76]mm · 10 of 36 slices shown]
[im 1/36]
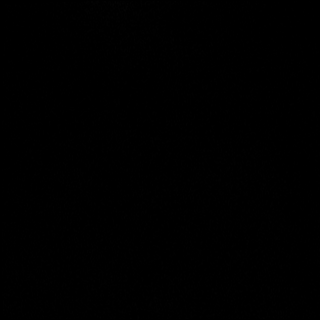
[im 4/36]
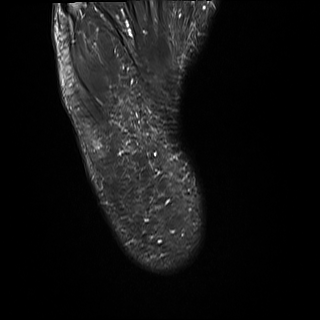
[im 8/36]
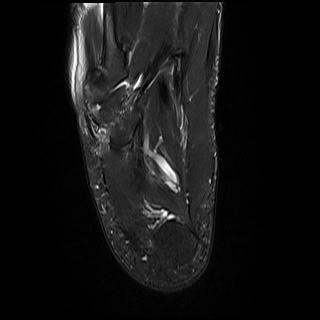
[im 12/36]
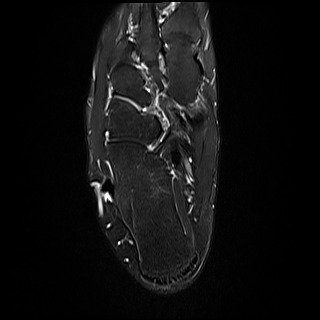
[im 16/36]
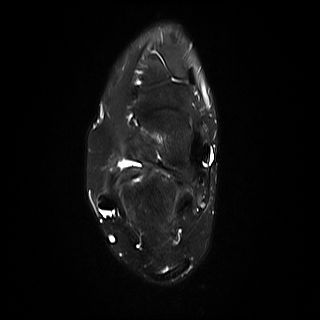
[im 20/36]
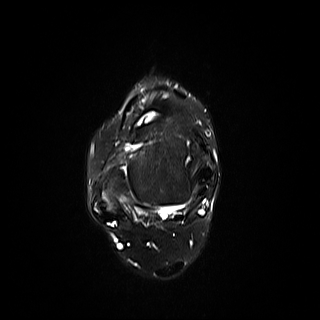
[im 24/36]
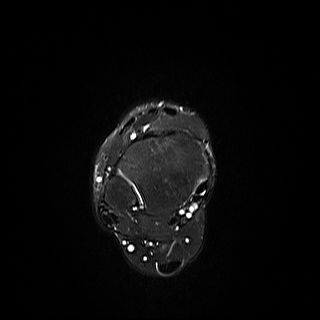
[im 28/36]
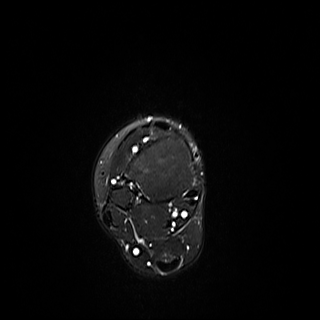
[im 32/36]
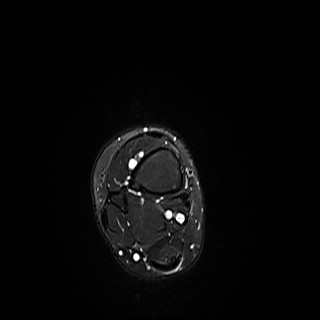
[im 36/36]
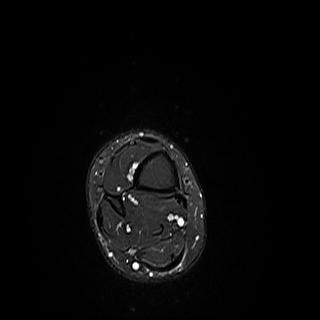

[Series 6: T2 · coronal · right · 3.0mm · 0.62mm/px · 10 of 40 slices shown]
[im 1/40]
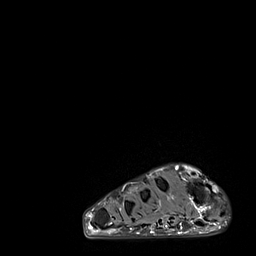
[im 5/40]
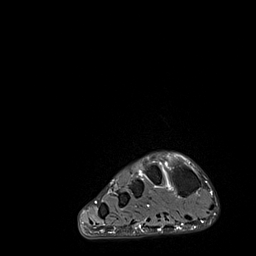
[im 9/40]
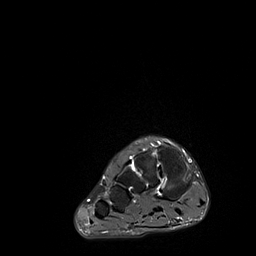
[im 14/40]
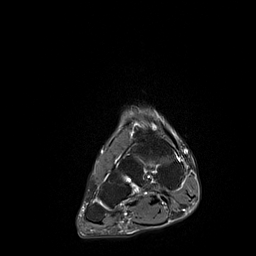
[im 18/40]
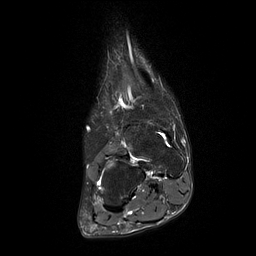
[im 22/40]
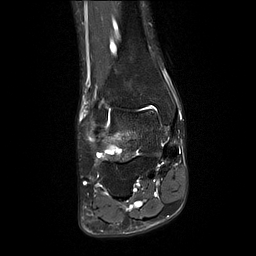
[im 27/40]
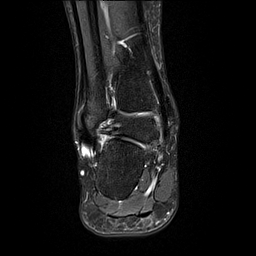
[im 31/40]
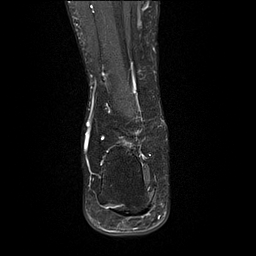
[im 35/40]
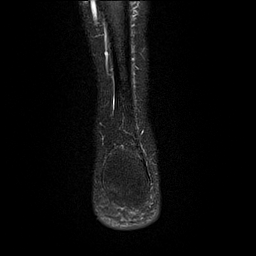
[im 40/40]
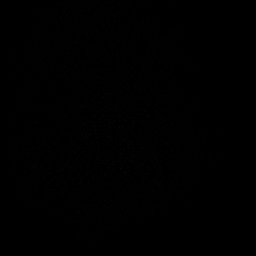

[Series 7: T1 · sagittal · right · 4.0mm · 0.70mm/px · 5 of 21 slices shown]
[im 1/21]
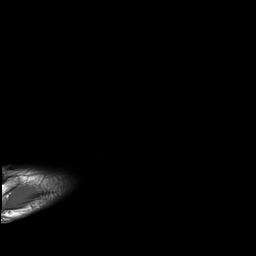
[im 6/21]
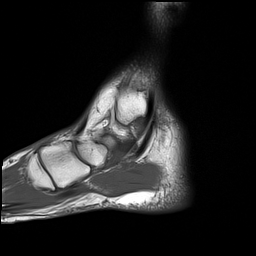
[im 11/21]
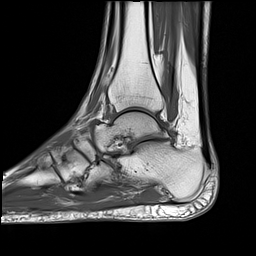
[im 16/21]
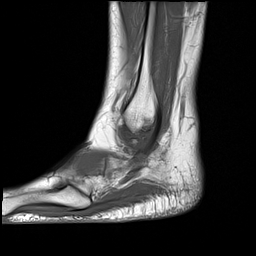
[im 21/21]
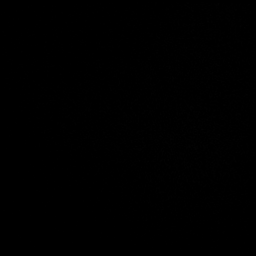

[Series 8: STIR · sagittal · right · 4.0mm · 0.35mm/px · 5 of 21 slices shown]
[im 1/21]
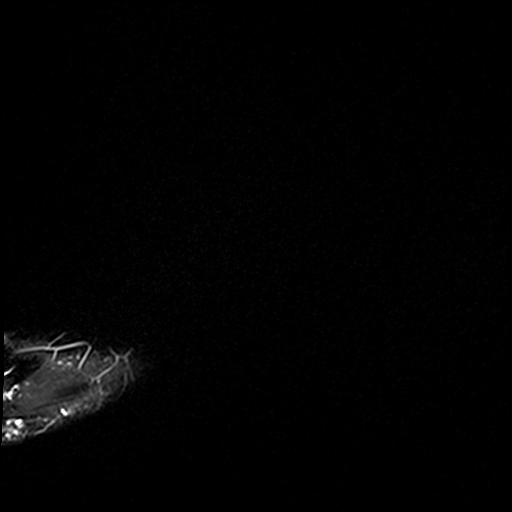
[im 6/21]
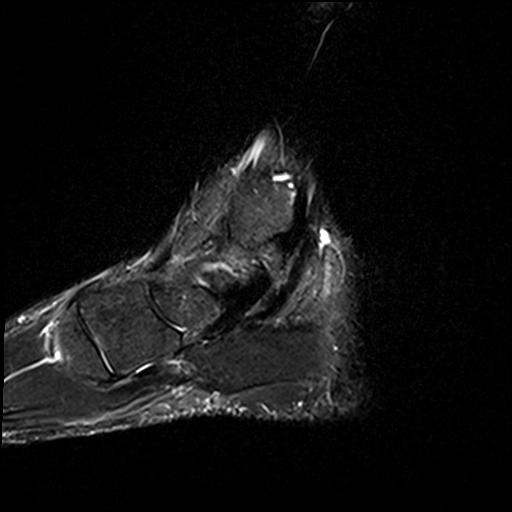
[im 11/21]
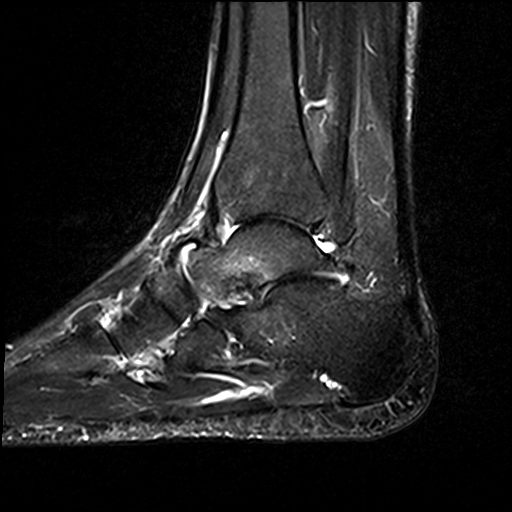
[im 16/21]
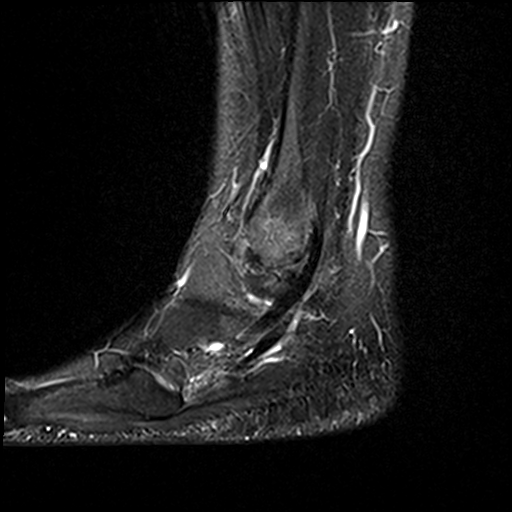
[im 21/21]
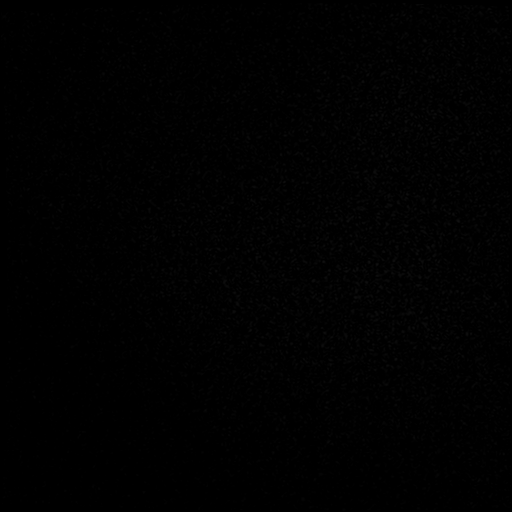

[40 of 40 positions shown; findings below may reference images not displayed]

FINDINGS: TENDONS

Peroneal: Long segment split tear of the retromalleolar and
inframalleolar aspects of the peroneus brevis tendon. Intact
peroneus longus tendon. Trace tenosynovial fluid.

Posteromedial: Intact tibialis posterior, flexor hallucis longus and
flexor digitorum longus tendons.

Anterior: Intact tibialis anterior, extensor hallucis longus and
extensor digitorum longus tendons.

Achilles: Intact.  Small insertional enthesophyte.

Plantar Fascia: Intact.

LIGAMENTS

Lateral: Acute appearing partial tear of the anterior talofibular
ligament which is markedly thickened and heterogeneous. The
posterior talofibular ligament and calcaneofibular ligaments are
also thickened and intermediate in signal intensity without focal
tear. Intact anterior and posterior tibiofibular ligaments.

Medial: Deltoid ligament and spring ligament complex intact.

CARTILAGE

Ankle Joint: No joint effusion or chondral defect.

Subtalar Joints/Sinus Tarsi: No joint effusion or chondral defect.
Preservation of the anatomic fat within the sinus tarsi.

Bones: Prominent marrow edema within the lateral process of the
talus and fibular tip. No definite osseous avulsion fracture.
Osseous alignment is maintained without dislocation. Mild scattered
degenerative changes within the midfoot.

Other: Soft tissue edema at the lateral ankle. No soft tissue fluid
collection or hematoma.
IMPRESSION: 1. Grade 2 sprain/partial tear of the anterior talofibular ligament.
2. Grade 1 sprains of the posterior talofibular and calcaneofibular
ligaments.
3. Long segment split tear of the retromalleolar and inframalleolar
aspects of the peroneus brevis tendon.
4. Prominent marrow edema within the lateral process of the talus
and fibular tip without definite osseous avulsion fracture.

## 2020-07-09 ENCOUNTER — Other Ambulatory Visit: Payer: Self-pay

## 2020-07-09 ENCOUNTER — Ambulatory Visit
Admission: RE | Admit: 2020-07-09 | Discharge: 2020-07-09 | Disposition: A | Discharge status: Home / Self Care | Payer: Managed Care, Other (non HMO) | Source: Ambulatory Visit | Attending: Obstetrics and Gynecology | Admitting: Obstetrics and Gynecology

## 2020-07-09 DIAGNOSIS — N6320 Unspecified lump in the left breast, unspecified quadrant: Secondary | ICD-10-CM | POA: Diagnosis present

## 2020-07-09 IMAGING — US US BREAST*L* LIMITED INC AXILLA
1 series · 5 of 5 positions shown · non-contrast
Comparison: Previous exam(s).

CLINICAL DATA: 54-year-old female presenting for first six-month
follow-up of a probably benign left breast mass.

EXAM:
ULTRASOUND OF THE LEFT BREAST

[Series 1: us breast*left* limited inc axilla · 0.04mm/px · 5 of 5 slices shown]
[im 1/5]
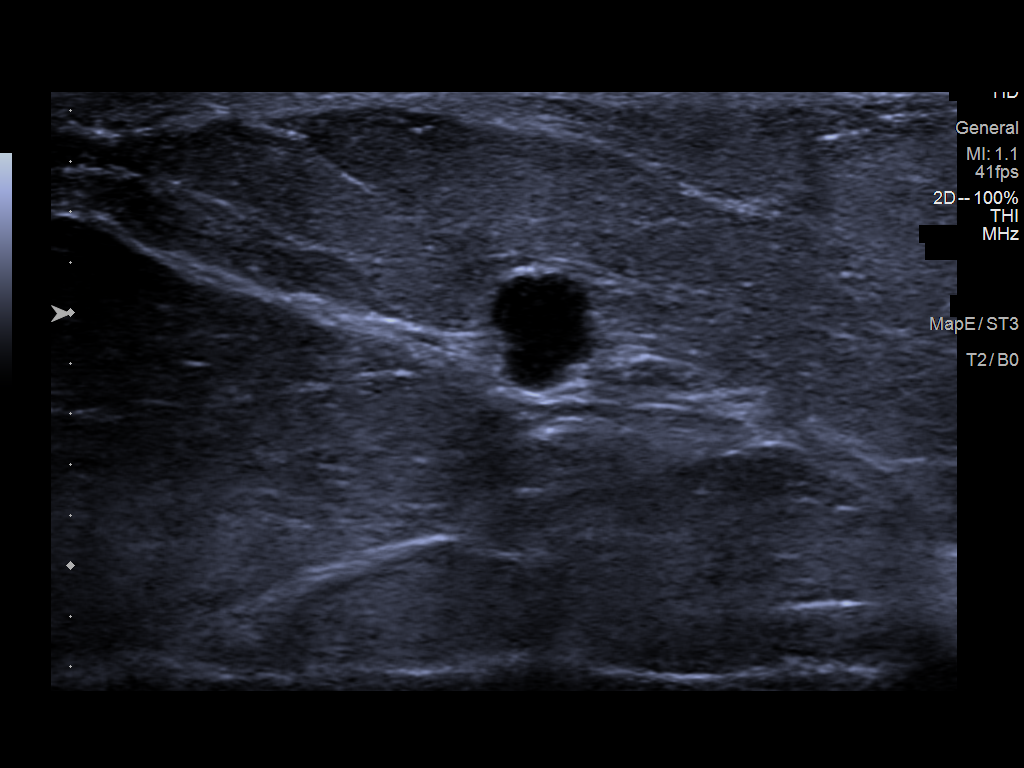
[im 2/5]
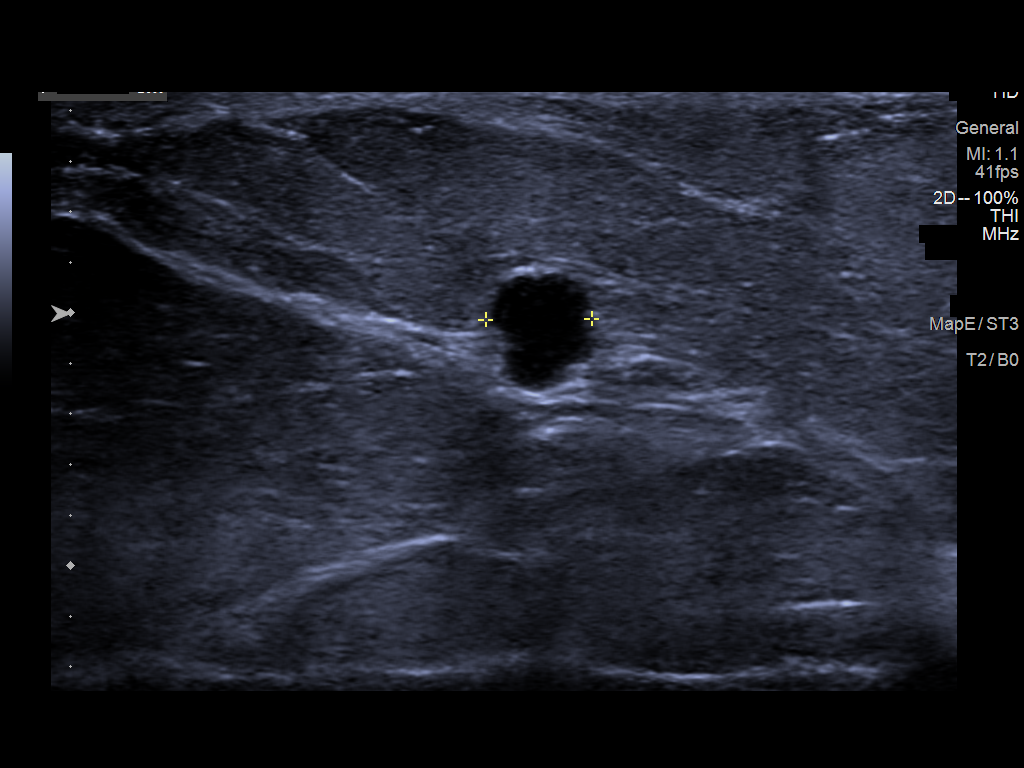
[im 3/5]
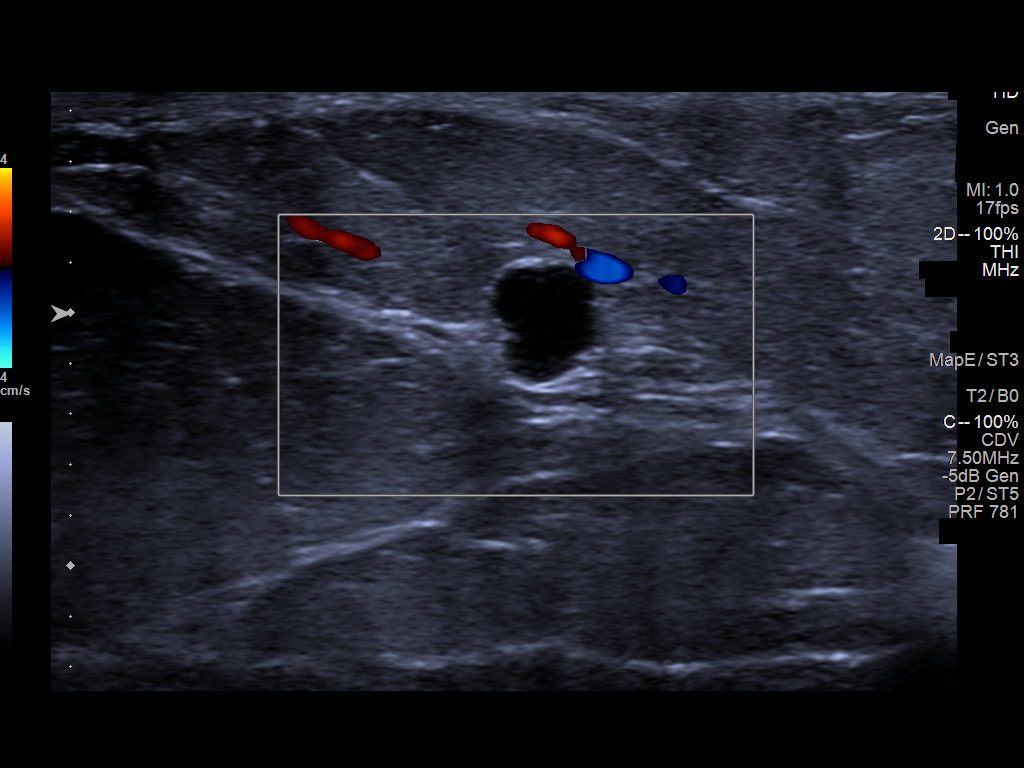
[im 4/5]
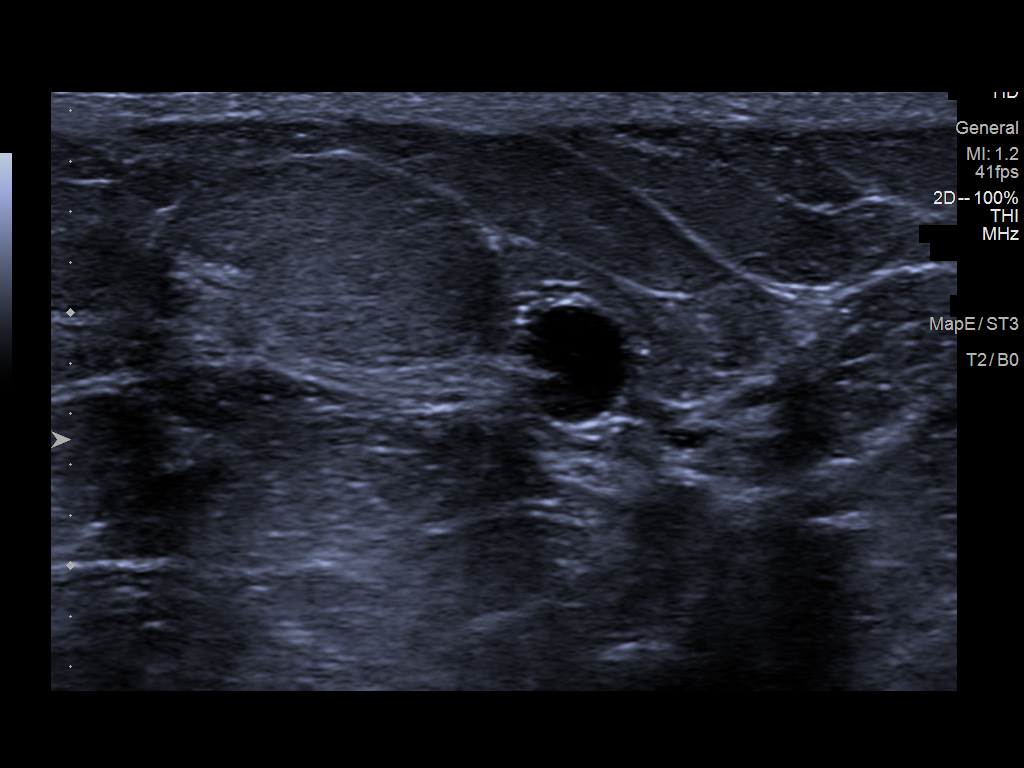
[im 5/5]
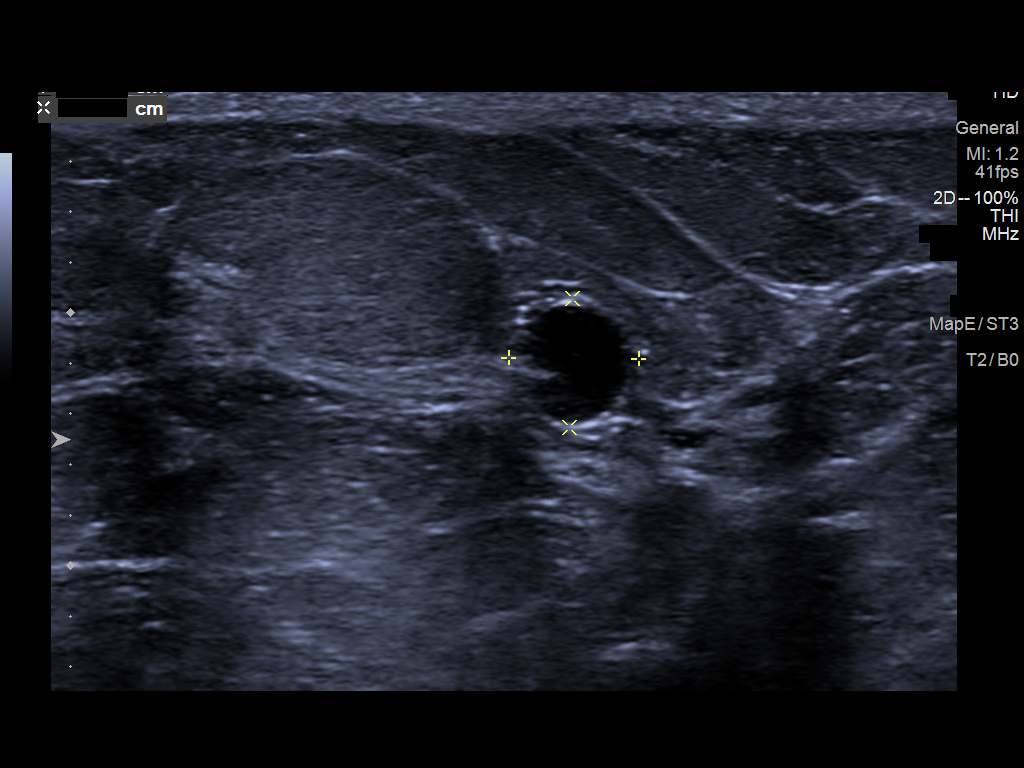

[5 of 5 positions shown; findings below may reference images not displayed]

FINDINGS: Targeted ultrasound is performed, showing stable appearance of a
circumscribed, hypoechoic mass at the 3 o'clock position 3 cm from
the nipple. It measures 5 x 5 x 4 mm (previously 5 x 5 x 4 mm).
IMPRESSION: Stable, probably benign left breast mass. Recommend continued
imaging follow-up to coincide with the patient's annual bilateral
mammograms.

RECOMMENDATION:
Bilateral diagnostic mammogram and left breast ultrasound due in
[DATE].

I have discussed the findings and recommendations with the patient.
If applicable, a reminder letter will be sent to the patient
regarding the next appointment.

BI-RADS CATEGORY  3: Probably benign.

## 2020-07-11 ENCOUNTER — Other Ambulatory Visit: Payer: Self-pay | Admitting: Obstetrics and Gynecology

## 2020-07-11 DIAGNOSIS — Z1231 Encounter for screening mammogram for malignant neoplasm of breast: Secondary | ICD-10-CM

## 2020-07-11 DIAGNOSIS — R928 Other abnormal and inconclusive findings on diagnostic imaging of breast: Secondary | ICD-10-CM

## 2020-07-11 DIAGNOSIS — N6320 Unspecified lump in the left breast, unspecified quadrant: Secondary | ICD-10-CM

## 2020-07-24 ENCOUNTER — Ambulatory Visit (INDEPENDENT_AMBULATORY_CARE_PROVIDER_SITE_OTHER): Payer: Managed Care, Other (non HMO) | Admitting: Orthopedic Surgery

## 2020-07-24 ENCOUNTER — Encounter: Payer: Self-pay | Admitting: Orthopedic Surgery

## 2020-07-24 VITALS — Ht 66.0 in | Wt 169.0 lb

## 2020-07-24 DIAGNOSIS — M25871 Other specified joint disorders, right ankle and foot: Secondary | ICD-10-CM

## 2020-07-24 MED ORDER — LIDOCAINE HCL 1 % IJ SOLN
2.0000 mL | INTRAMUSCULAR | Status: AC | PRN
  Administered 2020-07-24: 2 mL

## 2020-07-24 MED ORDER — METHYLPREDNISOLONE ACETATE 40 MG/ML IJ SUSP
40.0000 mg | INTRAMUSCULAR | Status: AC | PRN
  Administered 2020-07-24: 40 mg via INTRA_ARTICULAR
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
## 2020-08-25 ENCOUNTER — Ambulatory Visit: Payer: Managed Care, Other (non HMO) | Admitting: Orthopedic Surgery

## 2020-08-26 ENCOUNTER — Ambulatory Visit (INDEPENDENT_AMBULATORY_CARE_PROVIDER_SITE_OTHER): Payer: Managed Care, Other (non HMO) | Admitting: Orthopedic Surgery

## 2020-08-26 DIAGNOSIS — M25871 Other specified joint disorders, right ankle and foot: Secondary | ICD-10-CM | POA: Diagnosis not present

## 2020-08-27 ENCOUNTER — Encounter: Payer: Self-pay | Admitting: Orthopedic Surgery

## 2020-08-27 NOTE — Progress Notes (Signed)
Office Visit Note   Patient: Isabel Hines           Date of Birth: 1966-07-03           MRN: [id number] Visit Date: 08/26/2020              Requested by: [name], MD [address] [facility] [place],  [place] [address] PCP: [name], MD  Chief Complaint  Patient presents with  . Right Ankle - Follow-up    07/24/20 right ankle injection        HPI: Patient is a 55 year old woman who presents in follow-up for impingement symptoms right ankle. Patient states that her previous injection provided her about 85% relief for about 2 weeks she states that her pain is now returned. She has pain with activities of daily living swelling and pain over the ankle.  Assessment & Plan: Visit Diagnoses:  1. Impingement syndrome of right ankle     Plan: Discussed continued conservative treatment versus arthroscopic intervention risk and benefits of surgery were discussed including persistent pain risk of infection. Patient states she understands wished to proceed with arthroscopic intervention we will set this up at her convenience outpatient [facility] day surgery.  Follow-Up Instructions: Return if symptoms worsen or fail to improve.   Ortho Exam  Patient is alert, oriented, no adenopathy, well-dressed, normal affect, normal respiratory effort. Examination patient has good pulses she has an intact peroneal and posterior tibial tendon with no tenderness to palpation over the tendons. She is point tender to palpation anteriorly laterally over the joint line. Anterior drawer stable.  Imaging: No results found. No images are attached to the encounter.  Labs: No results found for: HGBA1C, ESRSEDRATE, CRP, LABURIC, REPTSTATUS, GRAMSTAIN, CULT, LABORGA   Lab Results  Component Value Date   ALBUMIN 4.5 05/02/2018   ALBUMIN 4.4 11/22/2016   ALBUMIN 3.7 04/23/2014    No results found for: MG No results found for: VD25OH  No results found for: PREALBUMIN CBC EXTENDED  Latest Ref Rng & Units 05/02/2018 11/22/2016 04/23/2014  WBC 3.6 - 11.0 K/uL 5.6 7.5 5.5  RBC 3.80 - 5.20 MIL/uL 4.76 4.77 4.62  HGB 12.0 - 16.0 g/dL 14.4 31.5 40.0  HCT 86.7 - 47.0 % 41.1 40.5 40.9  PLT 150 - 440 K/uL 212 199 204  NEUTROABS 1.4 - 6.5 K/uL - 4.9 -  LYMPHSABS 1.0 - 3.6 K/uL - 2.1 -     There is no height or weight on file to calculate BMI.  Orders:  No orders of the defined types were placed in this encounter.  No orders of the defined types were placed in this encounter.    Procedures: No procedures performed  Clinical Data: No additional findings.  ROS:  All other systems negative, except as noted in the HPI. Review of Systems  Objective: Vital Signs: There were no vitals taken for this visit.  Specialty Comments:  No specialty comments available.  PMFS History: Patient Active Problem List   Diagnosis Date Noted  . Other specified diseases of intestine   . Left lower quadrant pain   . Insomnia 06/25/2015  . Depression 04/02/2015  . Abnormal cervical Pap smear with positive HPV DNA test 06/10/2014  . Anxiety   . GERD (gastroesophageal reflux disease)    Past Medical History:  Diagnosis Date  . Anxiety   . Family history of adverse reaction to anesthesia    Sister may have had issues with blood pressure.  Marland Kitchen  GERD (gastroesophageal reflux disease)   . History of kidney stones   . Hyperlipidemia     Family History  Problem Relation Age of Onset  . Cancer Mother   . Hyperlipidemia Father   . Breast cancer Neg Hx     Past Surgical History:  Procedure Laterality Date  . BREAST BIOPSY Left 2003   neg  . BUNIONECTOMY    . TRICEPS TENDON REPAIR Right 02/26/2020   Procedure: TRICEPS TENDON REPAIR;  Surgeon: Christena Flake, MD;  Location: [facility] ORS;  Service: Orthopedics;  Laterality: Right;  . uterine ablation     Social History   Occupational History  . Not on file  Tobacco Use  . Smoking status: Current Every Day Smoker    Packs/day: 1.00     Types: Cigarettes  . Smokeless tobacco: Never Used  Vaping Use  . Vaping Use: Never used  Substance and Sexual Activity  . Alcohol use: Yes    Comment: occasionally  . Drug use: No  . Sexual activity: Not on file

## 2020-09-05 ENCOUNTER — Encounter: Payer: Self-pay | Admitting: Orthopedic Surgery

## 2020-10-07 ENCOUNTER — Ambulatory Visit (HOSPITAL_BASED_OUTPATIENT_CLINIC_OR_DEPARTMENT_OTHER): Admit: 2020-10-07 | Payer: Managed Care, Other (non HMO) | Admitting: Orthopedic Surgery

## 2020-10-07 ENCOUNTER — Encounter (HOSPITAL_BASED_OUTPATIENT_CLINIC_OR_DEPARTMENT_OTHER): Payer: Self-pay

## 2020-10-07 SURGERY — ARTHROSCOPY, ANKLE
Anesthesia: Choice | Site: Ankle | Laterality: Right

## 2020-12-29 ENCOUNTER — Other Ambulatory Visit: Payer: Managed Care, Other (non HMO)

## 2021-01-10 ENCOUNTER — Ambulatory Visit: Payer: Managed Care, Other (non HMO)

## 2021-01-12 ENCOUNTER — Other Ambulatory Visit: Payer: Self-pay

## 2021-01-12 ENCOUNTER — Emergency Department: Payer: Managed Care, Other (non HMO)

## 2021-01-12 ENCOUNTER — Encounter: Payer: Self-pay | Admitting: Radiology

## 2021-01-12 ENCOUNTER — Emergency Department
Admission: EM | Admit: 2021-01-12 | Discharge: 2021-01-12 | Disposition: A | Discharge status: Home / Self Care | Payer: Managed Care, Other (non HMO) | Attending: Student in an Organized Health Care Education/Training Program | Admitting: Student in an Organized Health Care Education/Training Program

## 2021-01-12 DIAGNOSIS — R0602 Shortness of breath: Secondary | ICD-10-CM | POA: Diagnosis present

## 2021-01-12 DIAGNOSIS — F17210 Nicotine dependence, cigarettes, uncomplicated: Secondary | ICD-10-CM | POA: Diagnosis not present

## 2021-01-12 DIAGNOSIS — J40 Bronchitis, not specified as acute or chronic: Secondary | ICD-10-CM | POA: Diagnosis not present

## 2021-01-12 LAB — BASIC METABOLIC PANEL
Anion gap: 10 (ref 5–15)
BUN: 13 mg/dL (ref 6–20)
CO2: 27 mmol/L (ref 22–32)
Calcium: 8.9 mg/dL (ref 8.9–10.3)
Chloride: 101 mmol/L (ref 98–111)
Creatinine, Ser: 0.79 mg/dL (ref 0.44–1.00)
GFR, Estimated: >60 mL/min (ref >60)
Glucose, Bld: 131 mg/dL — ABNORMAL HIGH (ref 70–99)
Potassium: 3.5 mmol/L (ref 3.5–5.1)
Sodium: 138 mmol/L (ref 135–145)

## 2021-01-12 LAB — CBC
HCT: 37.8 % (ref 36.0–46.0)
Hemoglobin: 12.7 g/dL (ref 12.0–15.0)
MCH: 28.3 pg (ref 26.0–34.0)
MCHC: 33.6 g/dL (ref 30.0–36.0)
MCV: 84.2 fL (ref 80.0–100.0)
Platelets: 223 10*3/uL (ref 150–400)
RBC: 4.49 MIL/uL (ref 3.87–5.11)
RDW: 15.3 % (ref 11.5–15.5)
WBC: 12.4 10*3/uL — ABNORMAL HIGH (ref 4.0–10.5)
nRBC: 0 % (ref 0.0–0.2)

## 2021-01-12 LAB — TROPONIN I (HIGH SENSITIVITY)
Troponin I (High Sensitivity): 3 ng/L (ref <18)
Troponin I (High Sensitivity): 4 ng/L (ref <18)

## 2021-01-12 LAB — D-DIMER, QUANTITATIVE: D-Dimer, Quant: 0.69 ug/mL-FEU — ABNORMAL HIGH (ref 0.00–0.50)

## 2021-01-12 IMAGING — CR DG CHEST 2V
1 series · 2 of 2 positions shown · non-contrast
Comparison: CT Abdomen and Pelvis [DATE].

CLINICAL DATA: 55-year-old female with shortness of breath and
chest tightness. Positive for [22]. Last month.

EXAM:
CHEST - 2 VIEW

[Series 1: dg chest 2 view · 0.14mm/px · 2 of 2 slices shown]
[im 1/2]
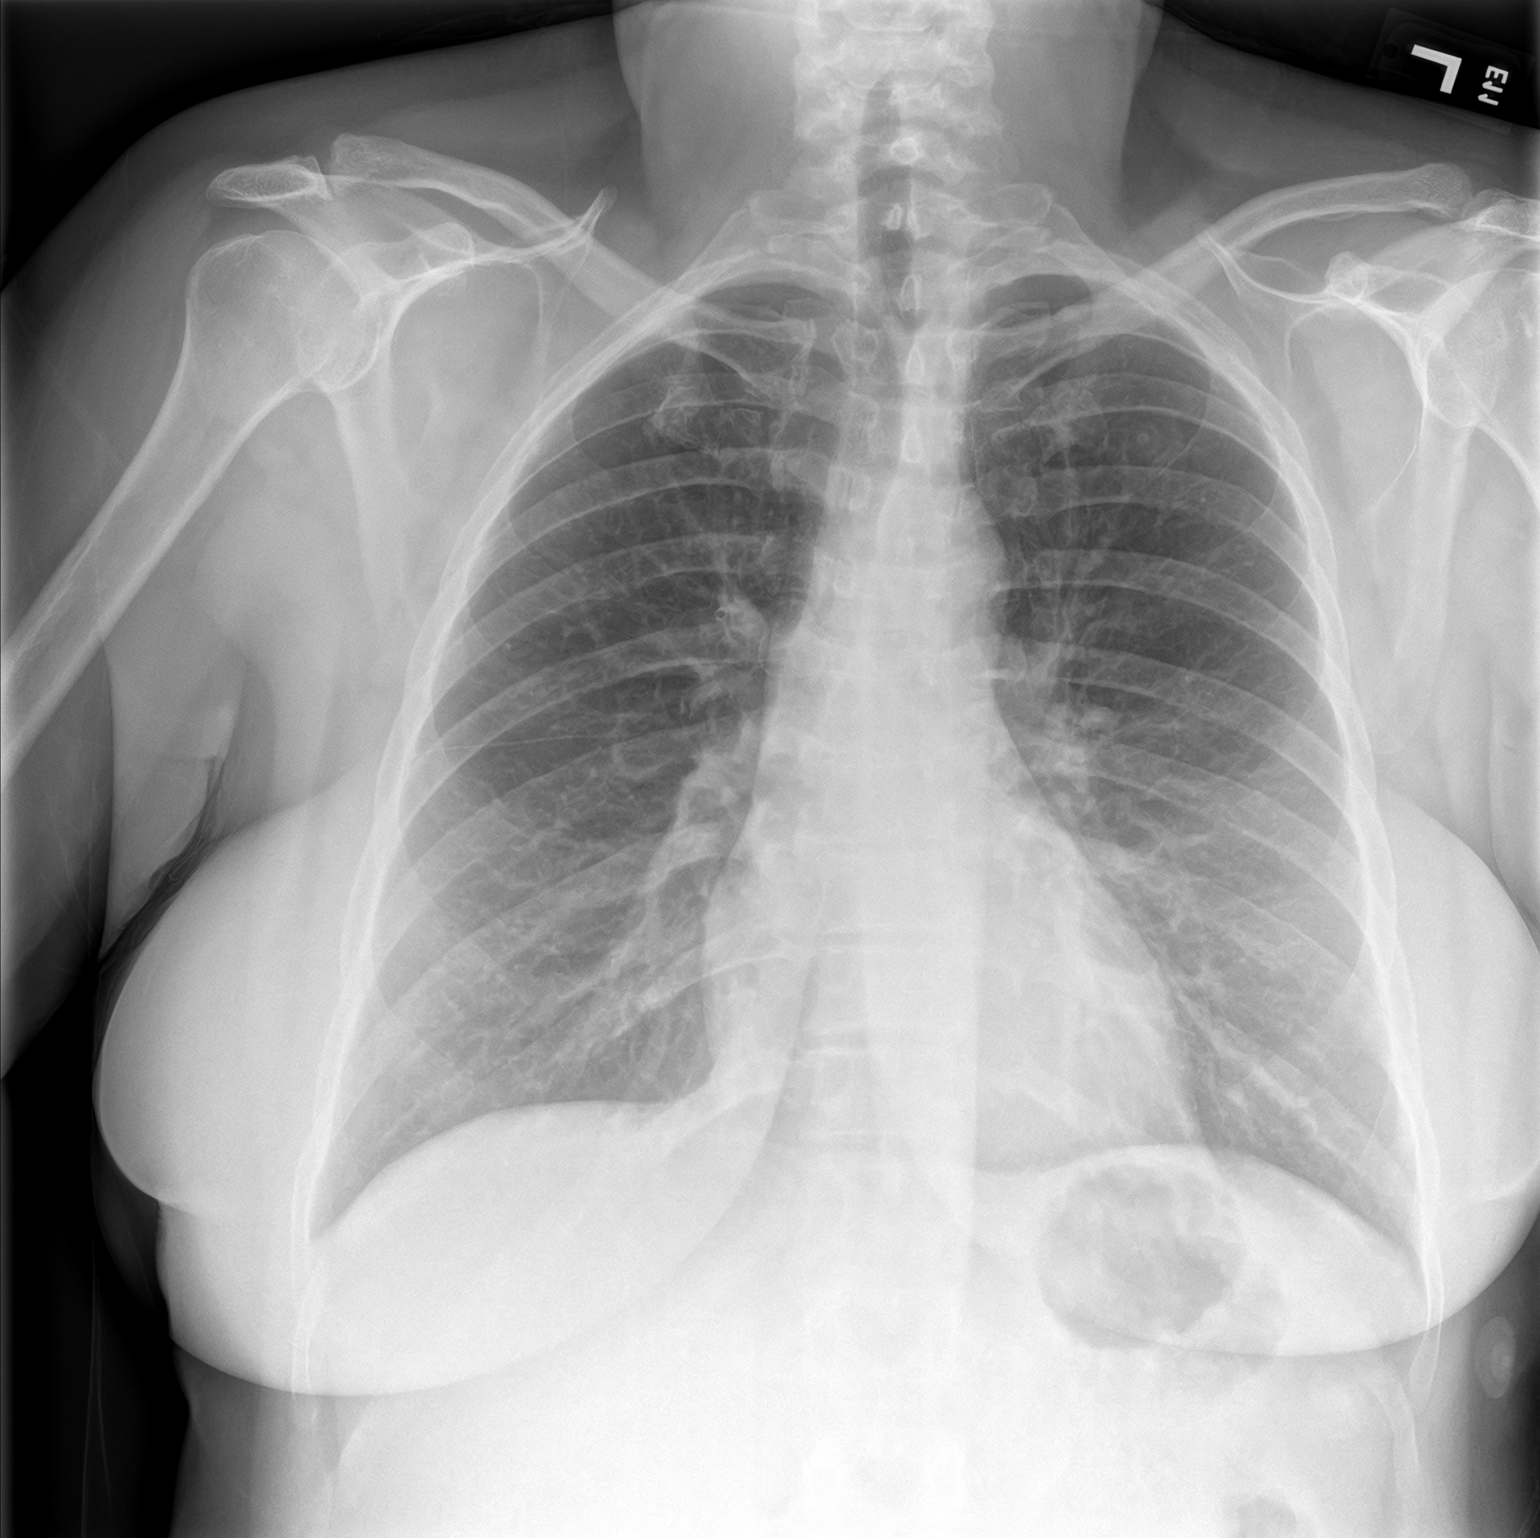
[im 2/2]
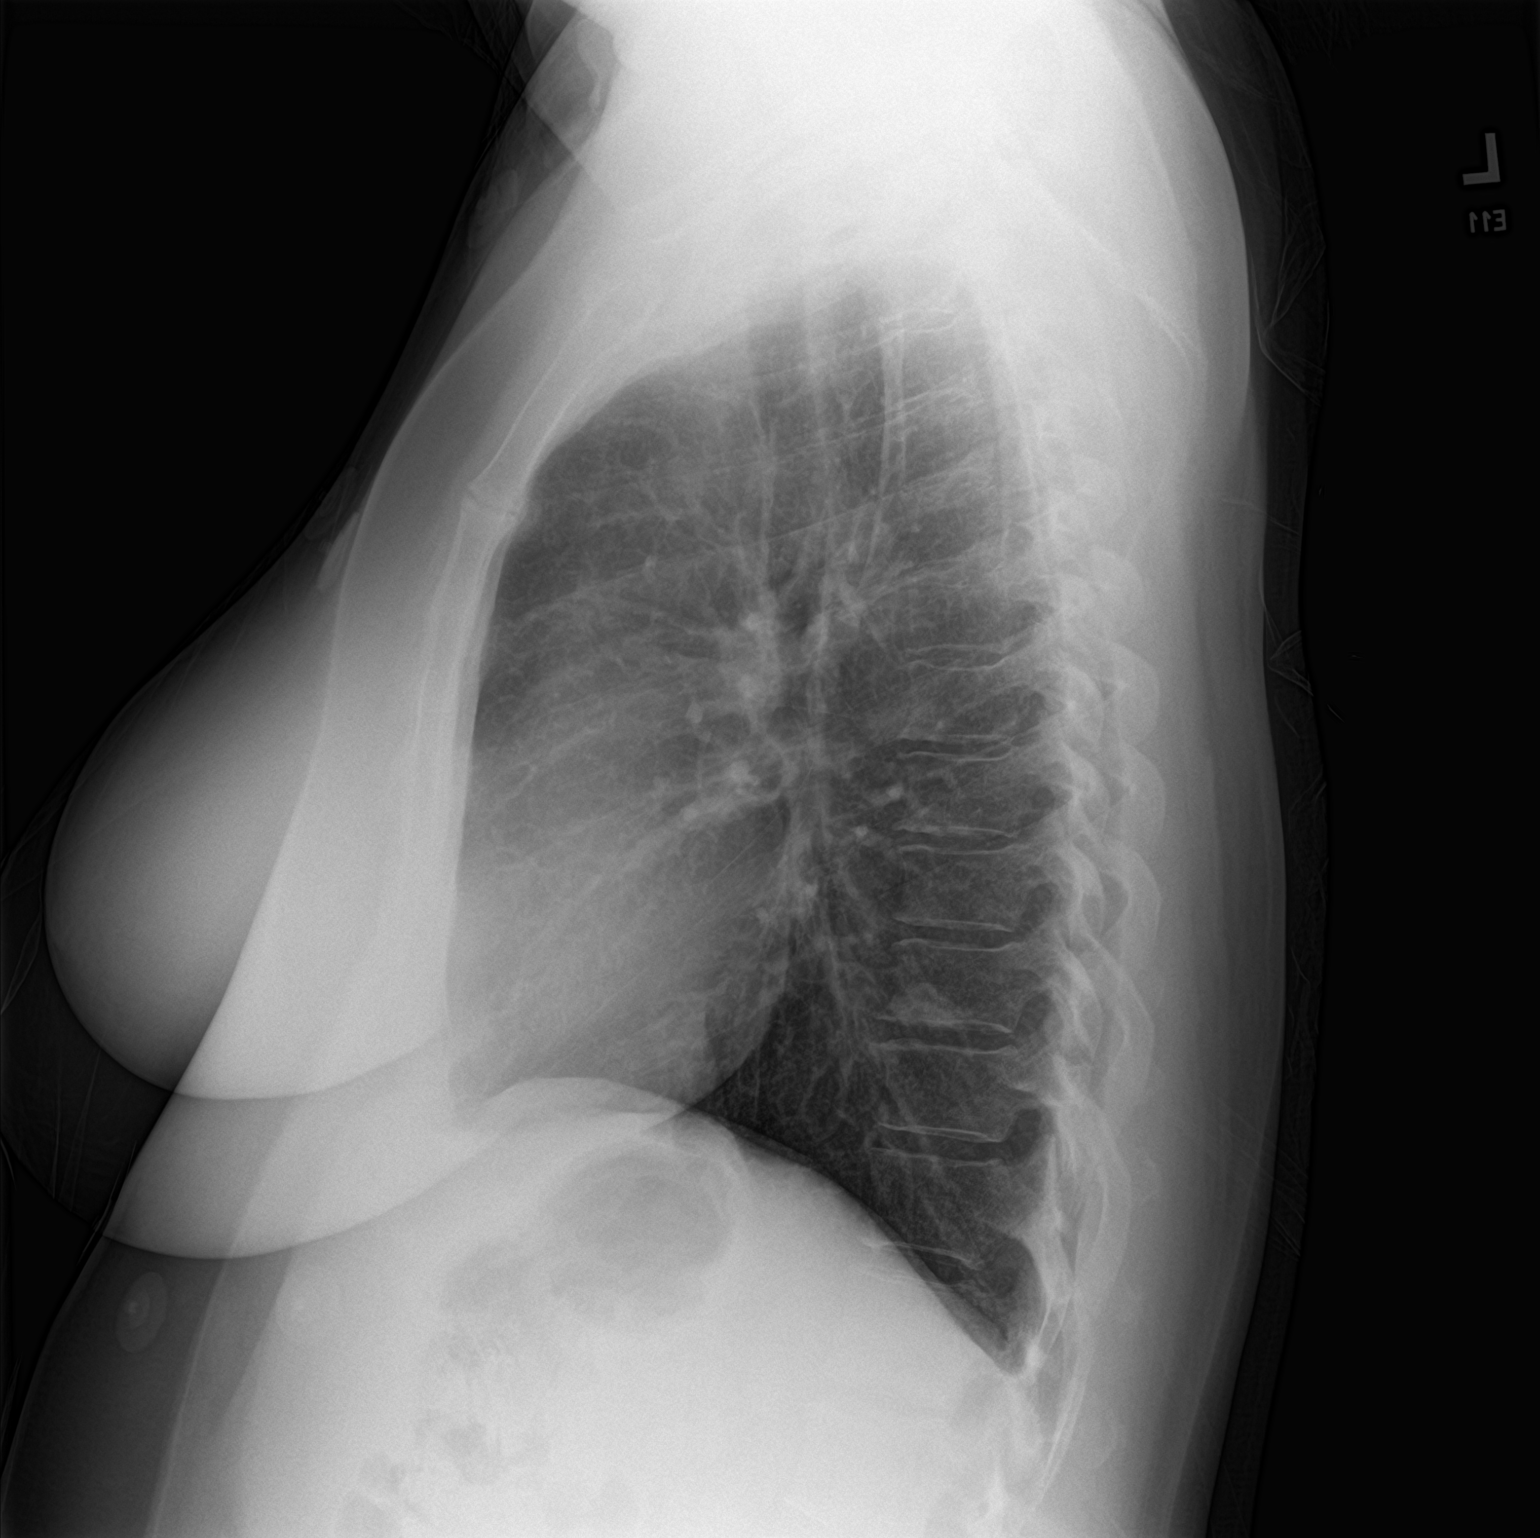

[2 of 2 positions shown; findings below may reference images not displayed]

FINDINGS: Lung volumes and mediastinal contours are normal. Visualized
tracheal air column is within normal limits. EKG button artifact in
the left upper lung. Both lungs appear clear. No pneumothorax or
pleural effusion.

No acute osseous abnormality identified. Negative visible bowel gas
pattern.
IMPRESSION: Negative.  No cardiopulmonary abnormality.

## 2021-01-12 IMAGING — CT CT ANGIO CHEST
2 of 7 series · 19 of 46 positions shown · IV contrast (APPLIED)
Comparison: None.

CLINICAL DATA: Cough and positive D-dimer. Concern for pulmonary
embolus.

EXAM:
CT ANGIOGRAPHY CHEST WITH CONTRAST
TECHNIQUE: Multidetector CT imaging of the chest was performed using the
standard protocol during bolus administration of intravenous
contrast. Multiplanar CT image reconstructions and MIPs were
obtained to evaluate the vascular anatomy.
CONTRAST:  75mL OMNIPAQUE IOHEXOL 350 MG/ML SOLN

[Series 5: thins · axial · 0.65mm/px · z∈[-283,-9]mm · 16 of 382 slices shown]
[im 20/382  lung]
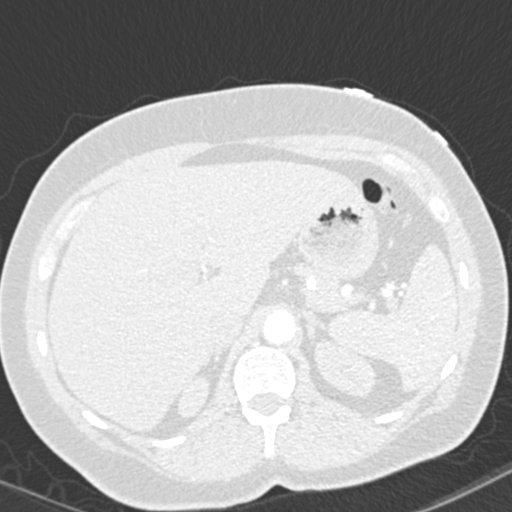
[im 39/382  soft-tissue]
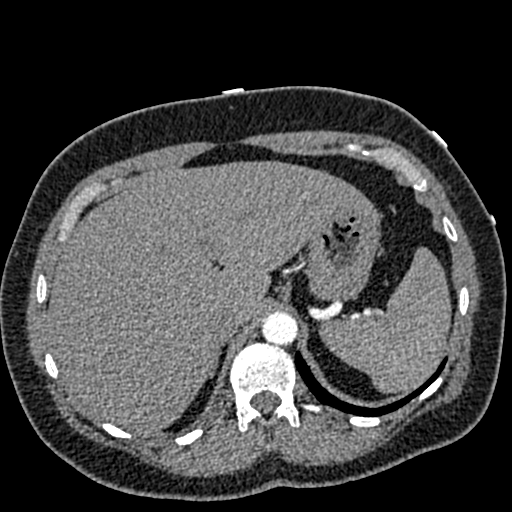
[im 58/382  lung]
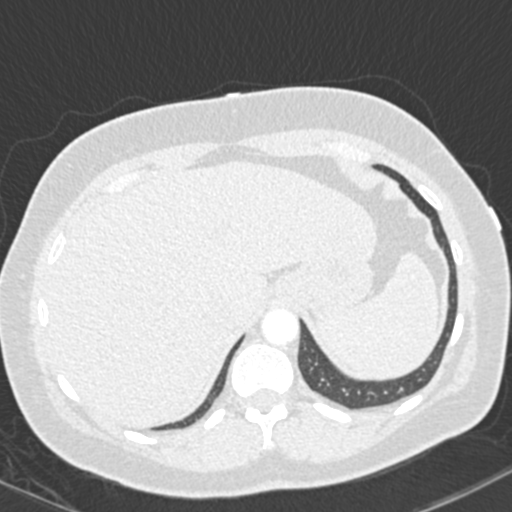
[im 96/382  soft-tissue]
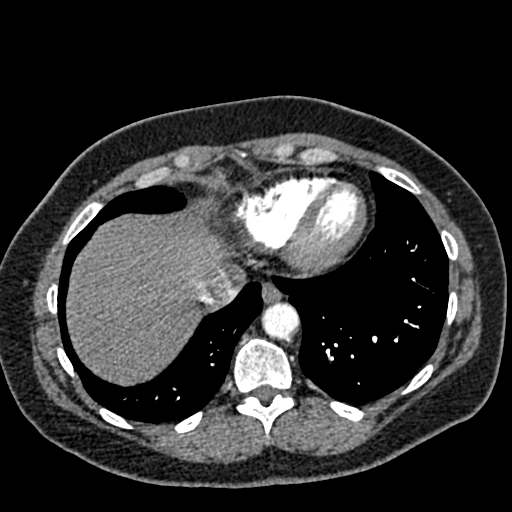
[im 115/382  lung]
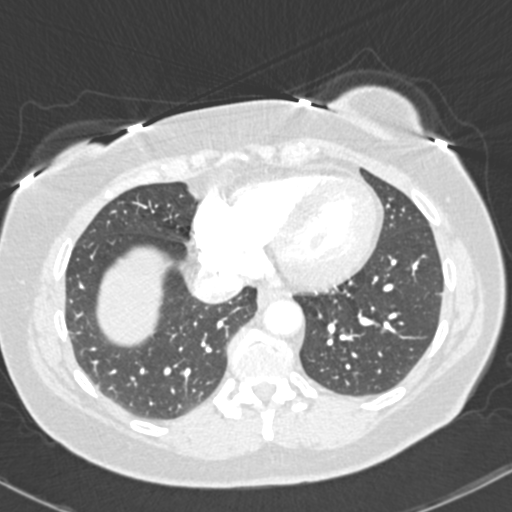
[im 134/382  soft-tissue]
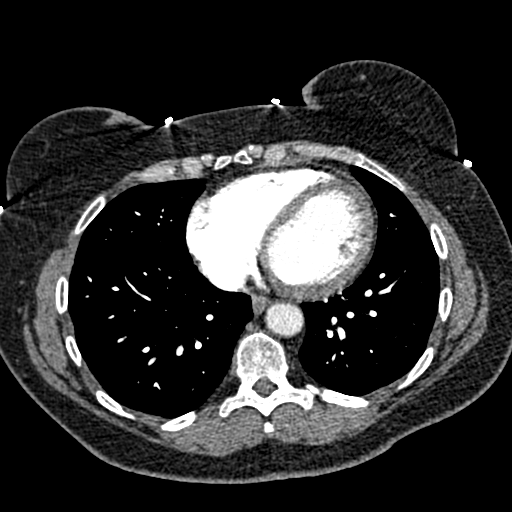
[im 153/382  lung]
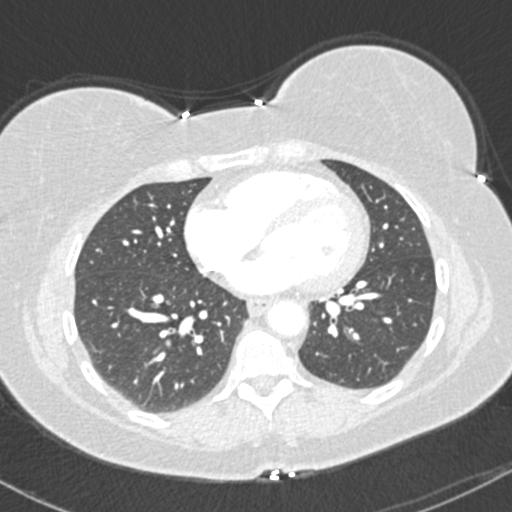
[im 172/382  soft-tissue]
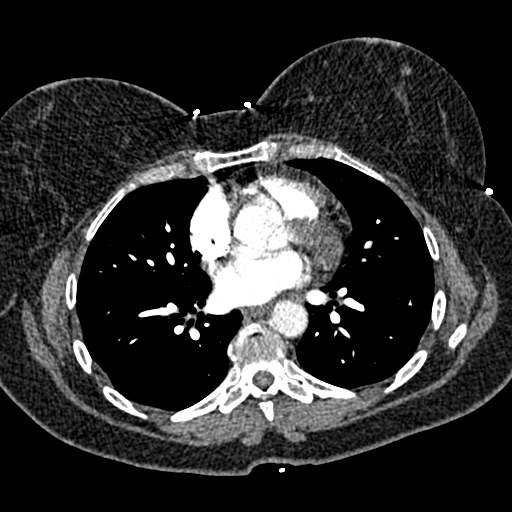
[im 210/382  lung]
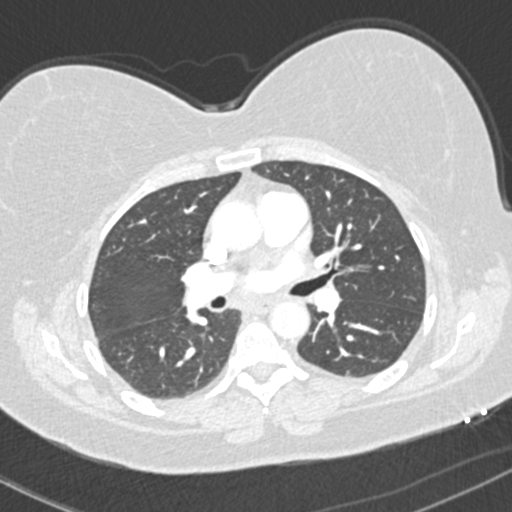
[im 229/382  soft-tissue]
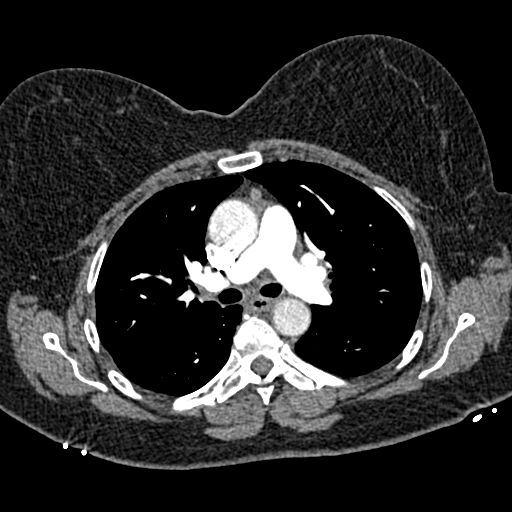
[im 248/382  lung]
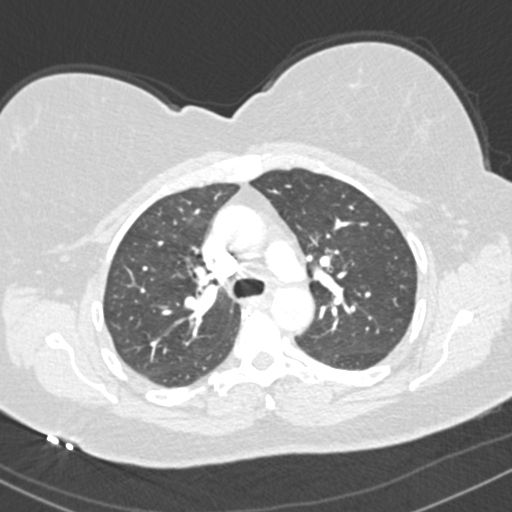
[im 267/382  soft-tissue]
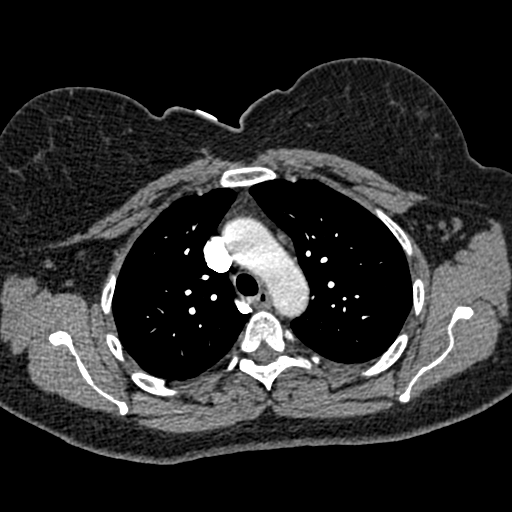
[im 286/382  lung]
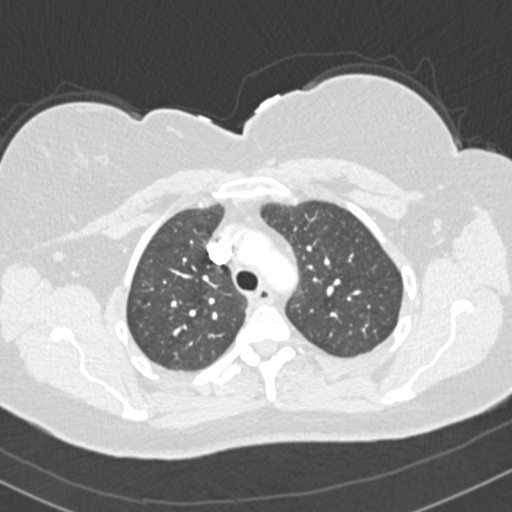
[im 324/382  soft-tissue]
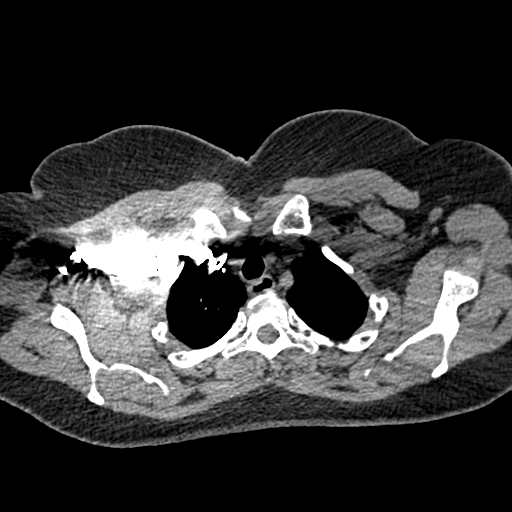
[im 343/382  lung]
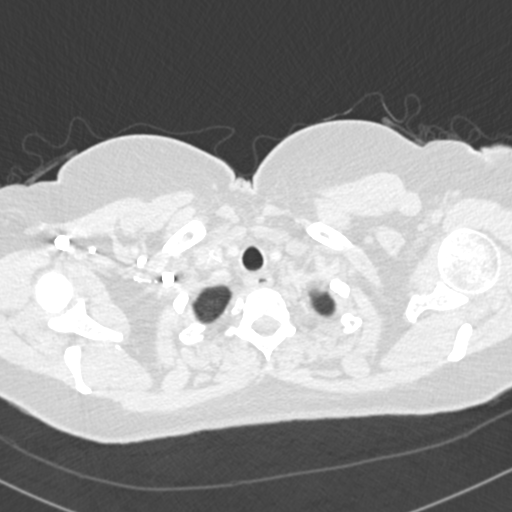
[im 362/382  soft-tissue]
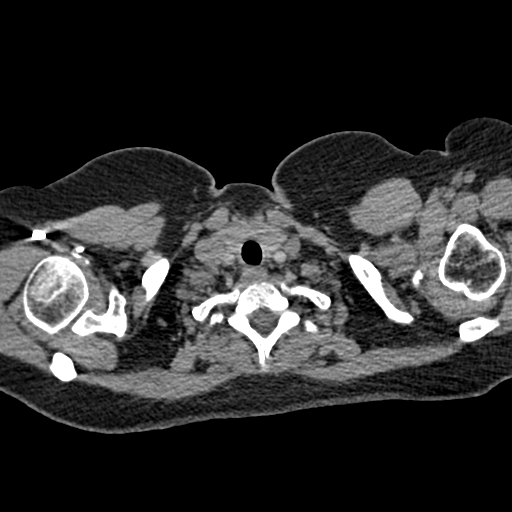

[Series 7: coronal mpr · coronal · 0.60mm/px · 3 of 86 slices shown]
[im 22/86  soft-tissue]
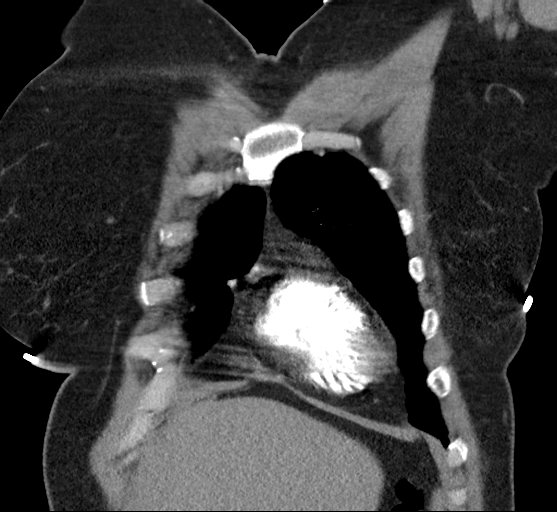
[im 43/86  soft-tissue]
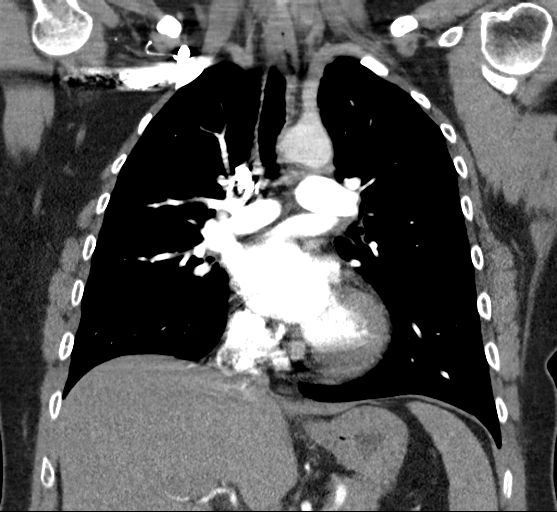
[im 64/86  soft-tissue]
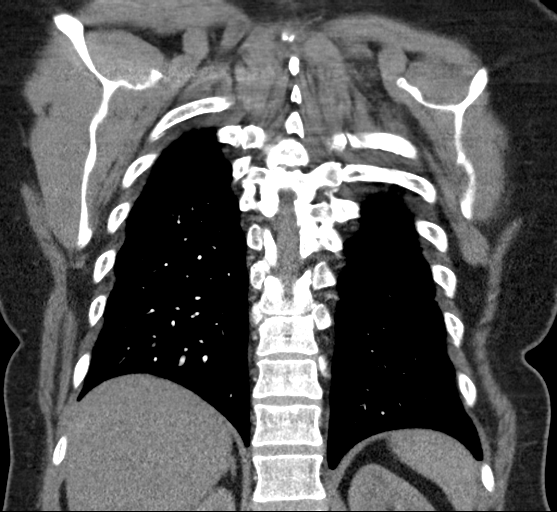

[19 of 46 positions shown; findings below may reference images not displayed]

FINDINGS: Cardiovascular: The heart size is normal. No substantial pericardial
effusion. No thoracic aortic aneurysm. There is no filling defect
within the opacified pulmonary arteries to suggest the presence of
an acute pulmonary embolus.

Mediastinum/Nodes: No mediastinal lymphadenopathy. There is no hilar
lymphadenopathy. The esophagus has normal imaging features. There is
no axillary lymphadenopathy.

Lungs/Pleura: No suspicious pulmonary nodule or mass. No focal
airspace consolidation. No pleural effusion.

Upper Abdomen: Unremarkable.

Musculoskeletal: No worrisome lytic or sclerotic osseous
abnormality.

Review of the MIP images confirms the above findings.
IMPRESSION: 1. No CT evidence for acute pulmonary embolus.
2. No acute findings in the chest.

## 2021-01-12 MED ORDER — ALBUTEROL SULFATE HFA 108 (90 BASE) MCG/ACT IN AERS: 2.0000 | INHALATION_SPRAY | Freq: Four times a day (QID) | RESPIRATORY_TRACT | 2 refills | Status: AC | PRN

## 2021-01-12 MED ORDER — PREDNISONE 10 MG PO TABS: 10.0000 mg | ORAL_TABLET | Freq: Every day | ORAL | 0 refills | Status: AC

## 2021-01-12 MED ORDER — IOHEXOL 350 MG/ML SOLN
75.0000 mL | Freq: Once | INTRAVENOUS | Status: AC | PRN
  Administered 2021-01-12: 75 mL via INTRAVENOUS

## 2021-01-12 NOTE — ED Notes (Signed)
Pt wants it noted that she has continued chest discomfort, sinus issues, and throat discomfort.

## 2021-01-12 NOTE — ED Notes (Signed)
EDP Robinson at bedside.  

## 2021-01-12 NOTE — ED Provider Notes (Signed)
[facility] Emergency Department Provider Note    Event Date/Time   First MD Initiated Contact with Patient 01/12/21 1103     (approximate)  I have reviewed the triage vital signs and the nursing notes.   HISTORY  Chief Complaint Shortness of Breath    HPI Isabel Hines is a 55 y.o. female recent COVID illness diagnosed in this past month who presents to the ER for progressive congestion cough shortness of breath and pain with deep inspiration.  Is currently taking prednisone and was recently placed on amoxicillin though is not having any improvement.  Denies any history of COPD or asthma.  Past Medical History:  Diagnosis Date   Anxiety    Family history of adverse reaction to anesthesia    Sister may have had issues with blood pressure.   GERD (gastroesophageal reflux disease)    History of kidney stones    Hyperlipidemia    Family History  Problem Relation Age of Onset   Cancer Mother    Hyperlipidemia Father    Breast cancer Neg Hx    Past Surgical History:  Procedure Laterality Date   BREAST BIOPSY Left 2003   neg   BUNIONECTOMY     TRICEPS TENDON REPAIR Right 02/26/2020   Procedure: TRICEPS TENDON REPAIR;  Surgeon: Christena Flake, MD;  Location: [facility] ORS;  Service: Orthopedics;  Laterality: Right;   uterine ablation     Patient Active Problem List   Diagnosis Date Noted   Other specified diseases of intestine    Left lower quadrant pain    Insomnia 06/25/2015   Depression 04/02/2015   Abnormal cervical Pap smear with positive HPV DNA test 06/10/2014   Anxiety    GERD (gastroesophageal reflux disease)       Prior to Admission medications   Medication Sig Start Date End Date Taking? Authorizing Provider  albuterol (VENTOLIN HFA) 108 (90 Base) MCG/ACT inhaler Inhale 2 puffs into the lungs every 6 (six) hours as needed for wheezing or shortness of breath. 01/12/21  Yes Willy Eddy, MD  predniSONE (DELTASONE) 10 MG tablet  Take 1 tablet (10 mg total) by mouth daily. Day 1-2: Take 50 mg  ( 5 pills) Day 3-4 : Take 40 mg (4pills) Day 5-6: Take 30 mg (3 pills) Day 7-8:  Take 20 mg (2 pills) Day 9:  Take 10mg  (1 pill) 01/12/21  Yes 01/14/21, MD  acetaminophen (TYLENOL) 500 MG tablet Take 1,000 mg by mouth every 6 (six) hours as needed for moderate pain.    [provider]  albuterol (VENTOLIN HFA) 108 (90 Base) MCG/ACT inhaler Inhale 2 puffs into the lungs every 6 (six) hours as needed for wheezing or shortness of breath.    [provider]  cholecalciferol (VITAMIN D3) 25 MCG (1000 UNIT) tablet Take 1,000 Units by mouth daily.    [provider]  citalopram (CELEXA) 20 MG tablet Take 1 daily. Patient taking differently: Take 30 mg by mouth at bedtime.  03/08/16   03/10/16 B, PA-C  EPINEPHrine (EPIPEN 2-PAK IJ) Inject as directed.    [provider]  estradiol (ESTRACE) 0.5 MG tablet Take 0.5 mg by mouth at bedtime.    [provider]  ibuprofen (ADVIL) 200 MG tablet Take 400 mg by mouth every 6 (six) hours as needed for moderate pain.    [provider]  metFORMIN (GLUCOPHAGE) 500 MG tablet Take 500 mg by mouth 2 (two) times daily.  [provider]  mometasone (NASONEX) 50 MCG/ACT nasal spray Place 1 spray into the nose daily.    [provider]  montelukast (SINGULAIR) 10 MG tablet Take 10 mg by mouth at bedtime.  07/24/18   [provider]  Multiple Vitamin (MULTIVITAMIN WITH MINERALS) TABS tablet Take 1 tablet by mouth daily.    [provider]  ondansetron (ZOFRAN ODT) 4 MG disintegrating tablet Take 1 tablet (4 mg total) by mouth every 8 (eight) hours as needed for nausea or vomiting. 02/26/20   Poggi, Excell Seltzer, MD  oxyCODONE (ROXICODONE) 5 MG immediate release tablet Take 1-2 tablets (5-10 mg total) by mouth every 4 (four) hours as needed for moderate pain or severe pain. Patient not taking: Reported on 04/16/2020 02/26/20    Poggi, Excell Seltzer, MD  phentermine (ADIPEX-P) 37.5 MG tablet Take 37.5 mg by mouth at bedtime. Patient not taking: Reported on 04/16/2020 01/24/20   [provider]  progesterone (PROMETRIUM) 100 MG capsule Take 100 mg by mouth at bedtime.    [provider]  rosuvastatin (CRESTOR) 10 MG tablet Take 10 mg by mouth at bedtime.    [provider]  vitamin B-12 (CYANOCOBALAMIN) 500 MCG tablet Take 500 mcg by mouth daily.    [provider]    Allergies Keflex [cephalexin], Sulfa antibiotics, Sulfasalazine, Delsym [dextromethorphan], Hydrocodone, Milk-related compounds, and Tramadol    Social History Social History   Tobacco Use   Smoking status: Every Day    Packs/day: 1.00    Pack years: 0.00    Types: Cigarettes   Smokeless tobacco: Never  Vaping Use   Vaping Use: Never used  Substance Use Topics   Alcohol use: Yes    Comment: occasionally   Drug use: No    Review of Systems Patient denies headaches, rhinorrhea, blurry vision, numbness, shortness of breath, chest pain, edema, cough, abdominal pain, nausea, vomiting, diarrhea, dysuria, fevers, rashes or hallucinations unless otherwise stated above in HPI. ____________________________________________   PHYSICAL EXAM:  VITAL SIGNS: Vitals:   01/12/21 0802 01/12/21 1312  BP: (!) 150/86 119/73  Pulse: 70 65  Resp: (!) 22 18  Temp: 98.5 F (36.9 C)   SpO2: 96% 98%    Constitutional: Alert and oriented.  Eyes: Conjunctivae are normal.  Head: Atraumatic. Nose: No congestion/rhinnorhea. Mouth/Throat: Mucous membranes are moist.   Neck: No stridor. Painless ROM.  Cardiovascular: Normal rate, regular rhythm. Grossly normal heart sounds.  Good peripheral circulation. Respiratory: Normal respiratory effort.  No retractions. Lungs with scattered coarse bs throughout Gastrointestinal: Soft and nontender. No distention. No abdominal bruits. No CVA tenderness. Genitourinary:  Musculoskeletal: No  lower extremity tenderness nor edema.  No joint effusions. Neurologic:  Normal speech and language. No gross focal neurologic deficits are appreciated. No facial droop Skin:  Skin is warm, dry and intact. No rash noted. Psychiatric: Mood and affect are normal. Speech and behavior are normal.  ____________________________________________   LABS (all labs ordered are listed, but only abnormal results are displayed)  Results for orders placed or performed during the hospital encounter of 01/12/21 (from the past 24 hour(s))  Basic metabolic panel     Status: Abnormal   Collection Time: 01/12/21  8:04 AM  Result Value Ref Range   Sodium 138 135 - 145 mmol/L   Potassium 3.5 3.5 - 5.1 mmol/L   Chloride 101 98 - 111 mmol/L   CO2 27 22 - 32 mmol/L   Glucose, Bld 131 (H) 70 - 99 mg/dL  BUN 13 6 - 20 mg/dL   Creatinine, Ser 1.610.79 0.44 - 1.00 mg/dL   Calcium 8.9 8.9 - 09.610.3 mg/dL   GFR, Estimated >04>60 >54>60 mL/min   Anion gap 10 5 - 15  CBC     Status: Abnormal   Collection Time: 01/12/21  8:04 AM  Result Value Ref Range   WBC 12.4 (H) 4.0 - 10.5 K/uL   RBC 4.49 3.87 - 5.11 MIL/uL   Hemoglobin 12.7 12.0 - 15.0 g/dL   HCT 09.837.8 11.936.0 - 14.746.0 %   MCV 84.2 80.0 - 100.0 fL   MCH 28.3 26.0 - 34.0 pg   MCHC 33.6 30.0 - 36.0 g/dL   RDW 82.915.3 56.211.5 - 13.015.5 %   Platelets 223 150 - 400 K/uL   nRBC 0.0 0.0 - 0.2 %  Troponin I (High Sensitivity)     Status: None   Collection Time: 01/12/21  8:04 AM  Result Value Ref Range   Troponin I (High Sensitivity) 3 <18 ng/L  Troponin I (High Sensitivity)     Status: None   Collection Time: 01/12/21 11:17 AM  Result Value Ref Range   Troponin I (High Sensitivity) 4 <18 ng/L  D-dimer, quantitative     Status: Abnormal   Collection Time: 01/12/21 11:17 AM  Result Value Ref Range   D-Dimer, Quant 0.69 (H) 0.00 - 0.50 ug/mL-FEU   ____________________________________________  EKG My review and personal interpretation at Time: 8:05   Indication: cough  Rate: 70   Rhythm: sinus Axis: normal Other: no stemi, nonspecific st abn ____________________________________________  RADIOLOGY  I personally reviewed all radiographic images ordered to evaluate for the above acute complaints and reviewed radiology reports and findings.  These findings were personally discussed with the patient.  Please see medical record for radiology report.  ____________________________________________   PROCEDURES  Procedure(s) performed:  Procedures    Critical Care performed: no ____________________________________________   INITIAL IMPRESSION / ASSESSMENT AND PLAN / ED COURSE  Pertinent labs & imaging results that were available during my care of the patient were reviewed by me and considered in my medical decision making (see chart for details).   DDX: ACS, pericarditis, pe, dissection, pna, bronchitis, costochondritis   Isabel Hines is a 55 y.o. who presents to the ED with presentation as described above.  Patient nontoxic-appearing does have bronchitic sounding lungs on exam.  Given recent COVID-19 illness D-dimer was ordered to further stratify for PE and it is elevated therefore will order CTA to further evaluate.  Clinical Course as of 01/12/21 1317  Mon Jan 12, 2021  1314 CT imaging without evidence of PE not consistent with ACS or CHF.  No consolidation on scan.  She is satting well on room air in no acute distress does have bronchitic presentation on exam therefore will give prednisone taper and refill albuterol.  She is already on antibiotics discussed strict return precautions.  Patient agreeable to plan. [PR]    Clinical Course User Index [PR] Willy Eddyobinson, Eddye Broxterman, MD    The patient was evaluated in Emergency Department today for the symptoms described in the history of present illness. He/she was evaluated in the context of the global COVID-19 pandemic, which necessitated consideration that the patient might be at risk for infection with the  SARS-CoV-2 virus that causes COVID-19. Institutional protocols and algorithms that pertain to the evaluation of patients at risk for COVID-19 are in a state of rapid change based on information released by regulatory bodies including the CDC and  federal and state organizations. These policies and algorithms were followed during the patient's care in the ED.  As part of my medical decision making, I reviewed the following data within the electronic MEDICAL RECORD NUMBER Nursing notes reviewed and incorporated, Labs reviewed, notes from prior ED visits and [place] Controlled Substance Database   ____________________________________________   FINAL CLINICAL IMPRESSION(S) / ED DIAGNOSES  Final diagnoses:  Bronchitis  SOB (shortness of breath)      NEW MEDICATIONS STARTED DURING THIS VISIT:  New Prescriptions   ALBUTEROL (VENTOLIN HFA) 108 (90 BASE) MCG/ACT INHALER    Inhale 2 puffs into the lungs every 6 (six) hours as needed for wheezing or shortness of breath.   PREDNISONE (DELTASONE) 10 MG TABLET    Take 1 tablet (10 mg total) by mouth daily. Day 1-2: Take 50 mg  ( 5 pills) Day 3-4 : Take 40 mg (4pills) Day 5-6: Take 30 mg (3 pills) Day 7-8:  Take 20 mg (2 pills) Day 9:  Take 10mg  (1 pill)     Note:  This document was prepared using Dragon voice recognition software and may include unintentional dictation errors.    , MD 01/12/21 [phone]

## 2021-01-12 NOTE — ED Notes (Signed)
Pt up to restroom.

## 2021-01-12 NOTE — ED Notes (Signed)
Pt signed printed d/c paperwork.  

## 2021-01-12 NOTE — ED Notes (Signed)
Pt up to restroom again

## 2021-01-12 NOTE — ED Notes (Signed)
See triage note. Pt resp reg/unlabored currently; cough present; reports initially felt like she had a "cold" but about a week after covid dx symptoms got much worse. States was checked for Flu and Strep at primary doc and they came back negative. Skin dry. Resting calmly on stretcher. Given warm blanket.

## 2021-01-12 NOTE — ED Notes (Signed)
Pt leaving for imaging.

## 2021-01-12 NOTE — ED Notes (Signed)
Blue and lt grn tubes sent to lab.  

## 2021-01-12 NOTE — ED Triage Notes (Signed)
Covid positive 5/10.  Has been to MD--on amoxicillin 875mg  bid and prednisone 10mg  daily.  Continued lung pain with shortness of breath and feeling like cannot get a good breath in.

## 2021-01-14 ENCOUNTER — Inpatient Hospital Stay: Admission: RE | Admit: 2021-01-14 | Payer: Managed Care, Other (non HMO) | Source: Ambulatory Visit

## 2021-01-14 ENCOUNTER — Other Ambulatory Visit: Payer: Managed Care, Other (non HMO)

## 2021-02-20 ENCOUNTER — Other Ambulatory Visit: Payer: Self-pay | Admitting: Otolaryngology

## 2021-02-20 DIAGNOSIS — G501 Atypical facial pain: Secondary | ICD-10-CM

## 2021-02-23 ENCOUNTER — Other Ambulatory Visit: Payer: Self-pay | Admitting: Otolaryngology

## 2021-02-23 DIAGNOSIS — G501 Atypical facial pain: Secondary | ICD-10-CM

## 2021-02-23 DIAGNOSIS — R221 Localized swelling, mass and lump, neck: Secondary | ICD-10-CM

## 2021-03-03 ENCOUNTER — Ambulatory Visit
Admission: RE | Admit: 2021-03-03 | Discharge: 2021-03-03 | Disposition: A | Discharge status: Home / Self Care | Payer: Managed Care, Other (non HMO) | Source: Ambulatory Visit | Attending: Obstetrics and Gynecology | Admitting: Obstetrics and Gynecology

## 2021-03-03 ENCOUNTER — Other Ambulatory Visit: Payer: Self-pay

## 2021-03-03 DIAGNOSIS — N6320 Unspecified lump in the left breast, unspecified quadrant: Secondary | ICD-10-CM | POA: Insufficient documentation

## 2021-03-03 DIAGNOSIS — Z1231 Encounter for screening mammogram for malignant neoplasm of breast: Secondary | ICD-10-CM | POA: Diagnosis present

## 2021-03-03 DIAGNOSIS — R928 Other abnormal and inconclusive findings on diagnostic imaging of breast: Secondary | ICD-10-CM | POA: Insufficient documentation

## 2021-03-03 IMAGING — MG DIGITAL DIAGNOSTIC BILAT W/ TOMO W/ CAD
8 series · 8 of 24 positions shown · non-contrast
Comparison: Prior films

CLINICAL DATA: Short-term follow-up left breast mass. Patient also
complains left breast pain in the area that is currently being
followed.

EXAM:
DIGITAL DIAGNOSTIC BILATERAL MAMMOGRAM WITH TOMOSYNTHESIS AND CAD;
ULTRASOUND LEFT BREAST LIMITED
TECHNIQUE: Bilateral digital diagnostic mammography and breast tomosynthesis
was performed. The images were evaluated with computer-aided
detection.; Targeted ultrasound examination of the left breast was
performed.

[R CC synth-2D]
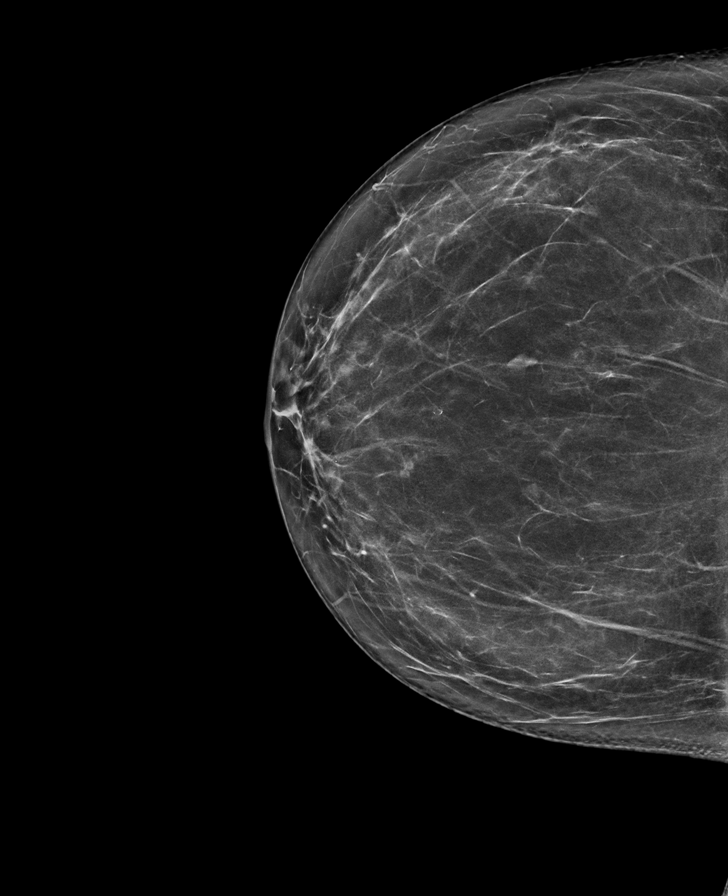

[R MLO synth-2D]
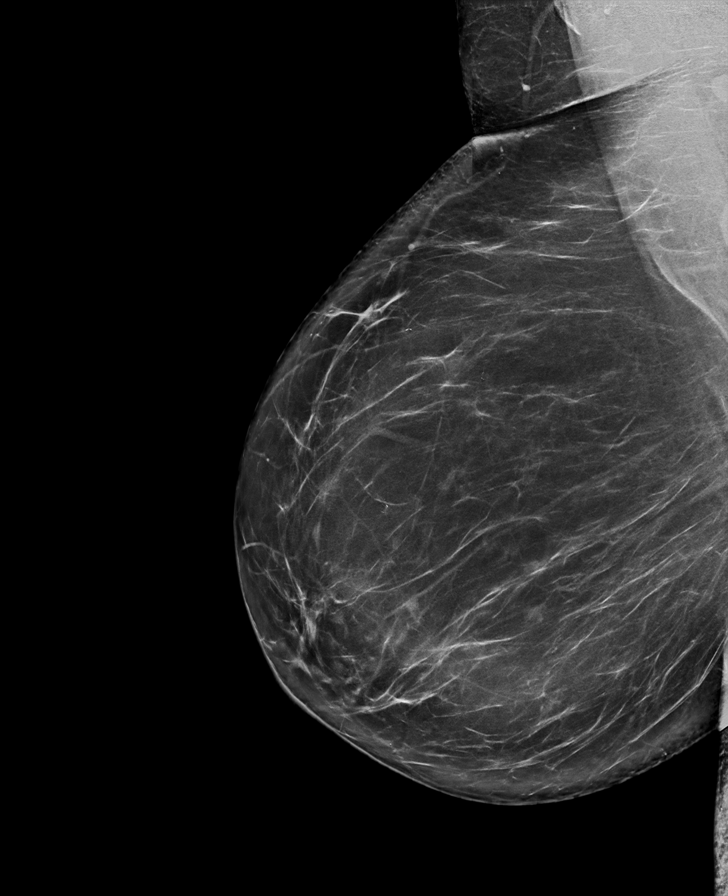

[L CC synth-2D]
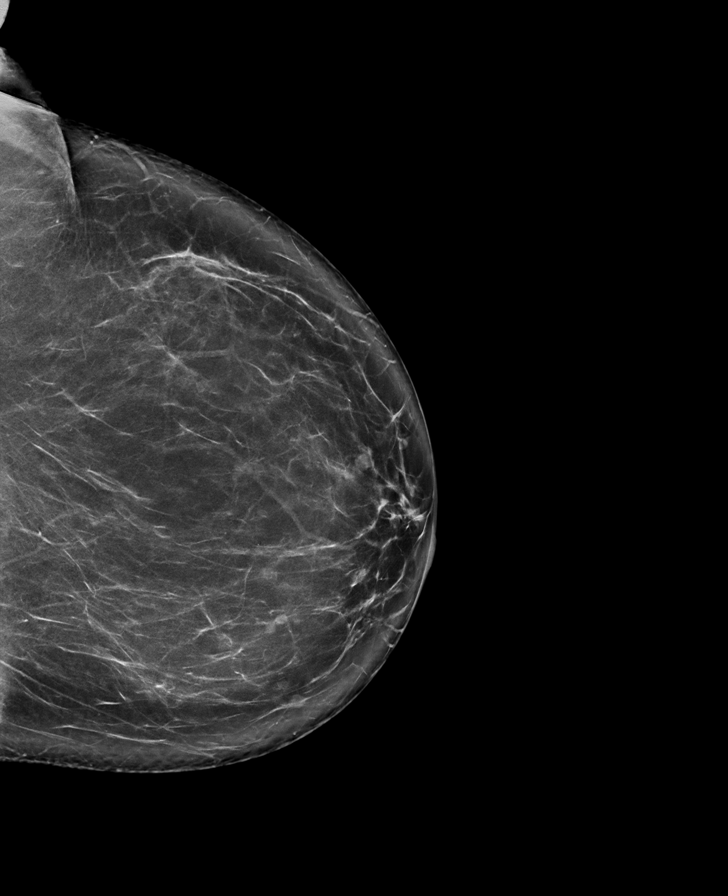

[L MLO synth-2D]
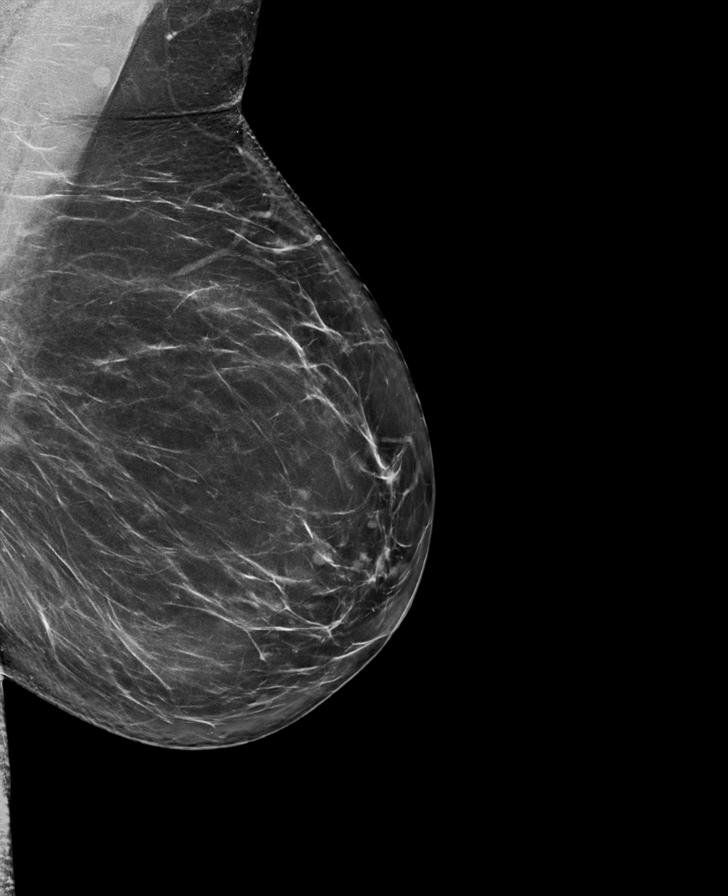

[L MLO tomo · tomo slice 43/84.0]
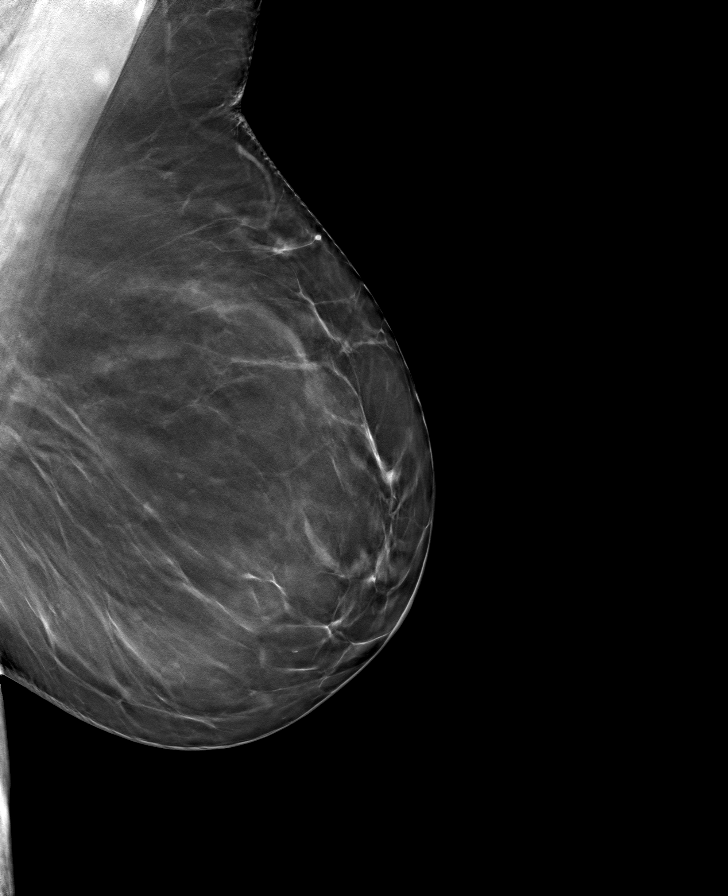

[R CC tomo · tomo slice 38/75.0]
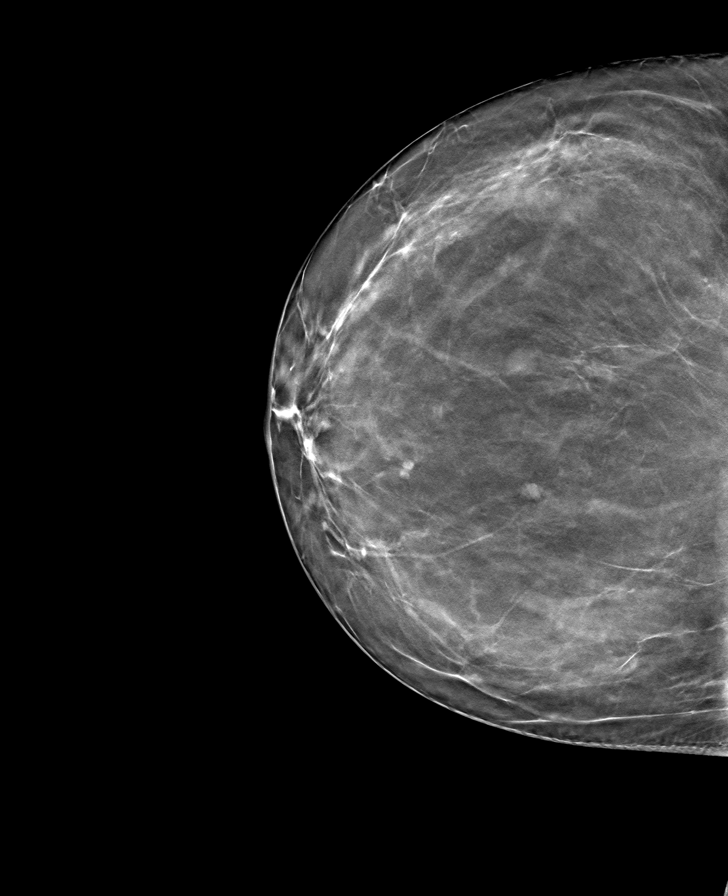

[R MLO tomo · tomo slice 45/89.0]
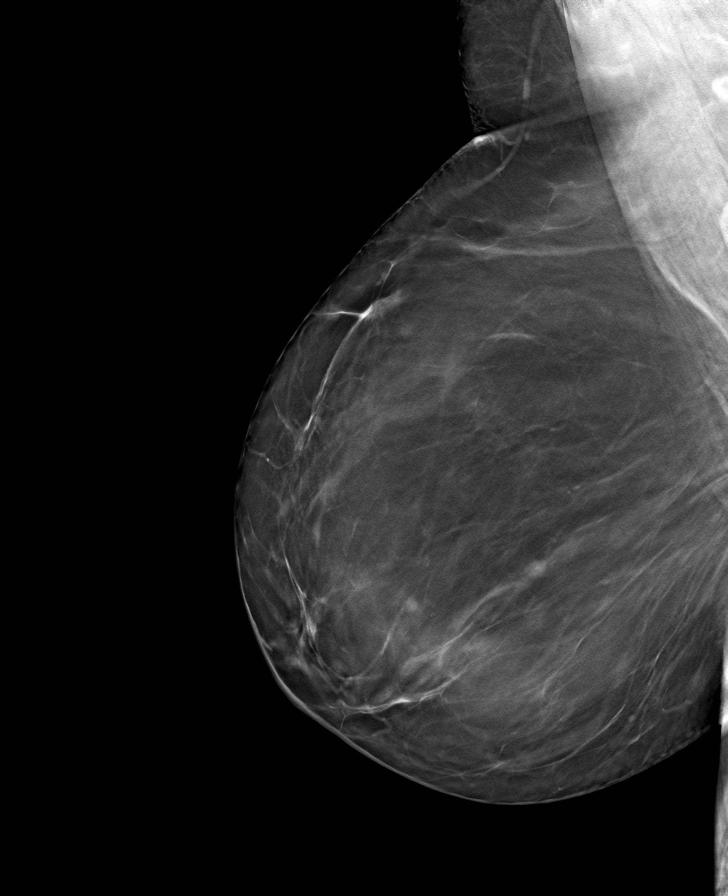

[L CC tomo · tomo slice 44/87.0]
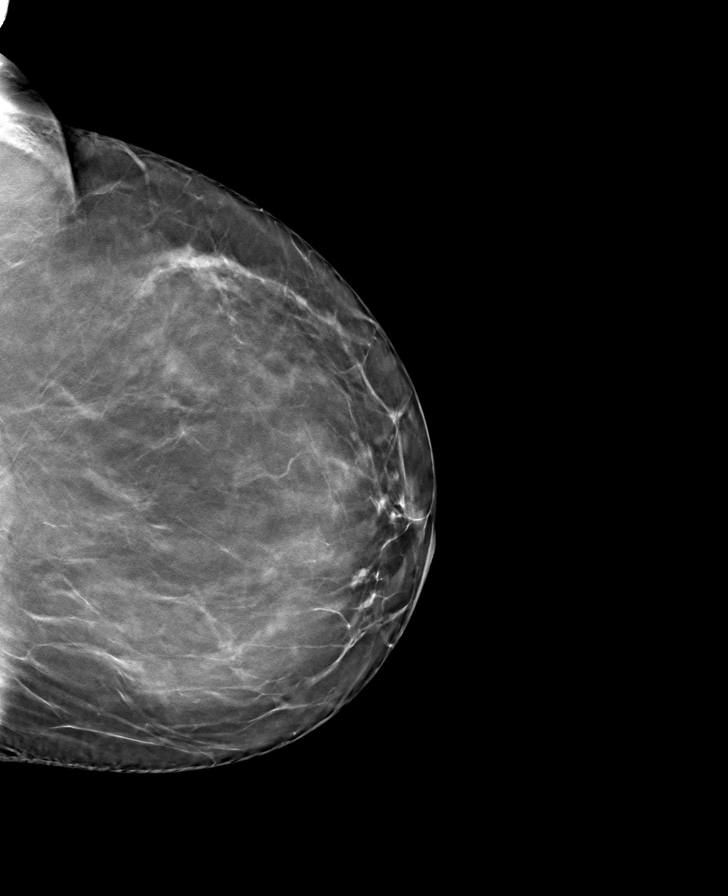

[8 of 24 positions shown; findings below may reference images not displayed]

ACR Breast Density Category b: There are scattered areas of
fibroglandular density.
FINDINGS: Cc and MLO views of bilateral breasts are submitted. Previously
noted mass in the subareolar lateral left breast is stable compared
prior exam. The right breast parenchyma is stable. No suspicious
abnormality is identified.

Targeted ultrasound is performed, showing probable minimal
complicated cyst with through transmission at the left breast 3
o'clock 3 cm from nipple measuring 5 x 5 x 5 mm unchanged compared
prior exam.
IMPRESSION: Probable benign findings.

RECOMMENDATION:
Bilateral diagnostic and left breast ultrasound in 12 months.

I have discussed the findings and recommendations with the patient.
If applicable, a reminder letter will be sent to the patient
regarding the next appointment.

BI-RADS CATEGORY  3: Probably benign.

## 2021-03-03 IMAGING — US US BREAST*L* LIMITED INC AXILLA
1 series · 4 of 4 positions shown · non-contrast
Comparison: Prior films

CLINICAL DATA: Short-term follow-up left breast mass. Patient also
complains left breast pain in the area that is currently being
followed.

EXAM:
DIGITAL DIAGNOSTIC BILATERAL MAMMOGRAM WITH TOMOSYNTHESIS AND CAD;
ULTRASOUND LEFT BREAST LIMITED
TECHNIQUE: Bilateral digital diagnostic mammography and breast tomosynthesis
was performed. The images were evaluated with computer-aided
detection.; Targeted ultrasound examination of the left breast was
performed.

[Series 1: us breast*left* limited inc axilla · 0.05mm/px · 4 of 4 slices shown]
[im 1/4]
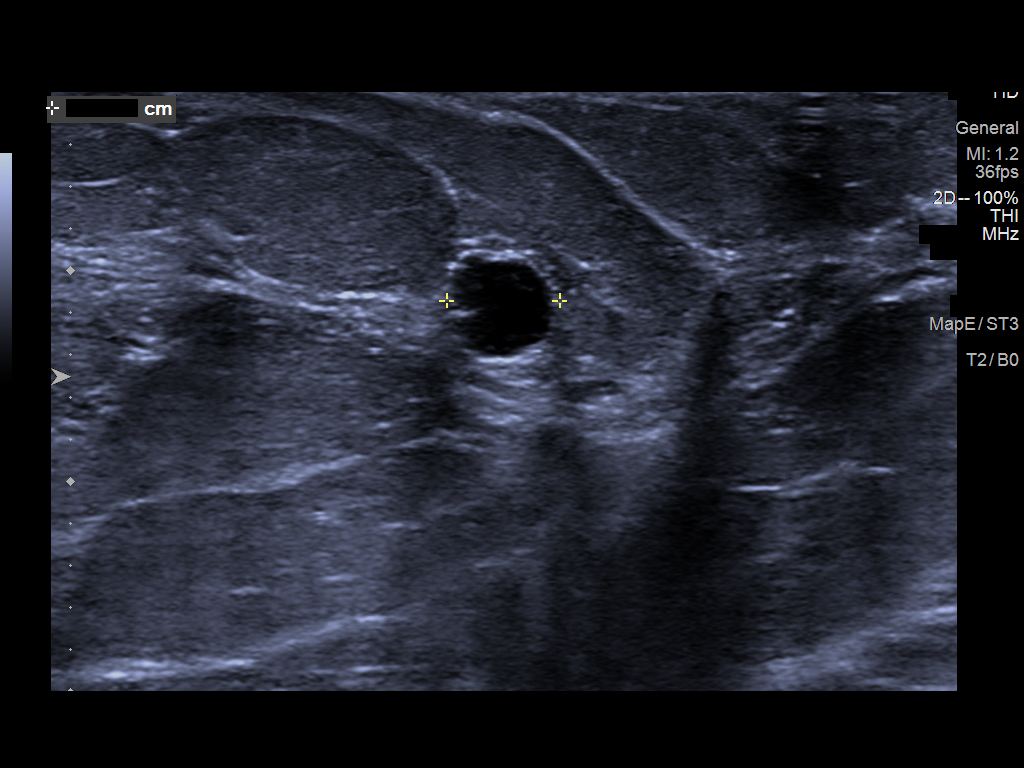
[im 2/4]
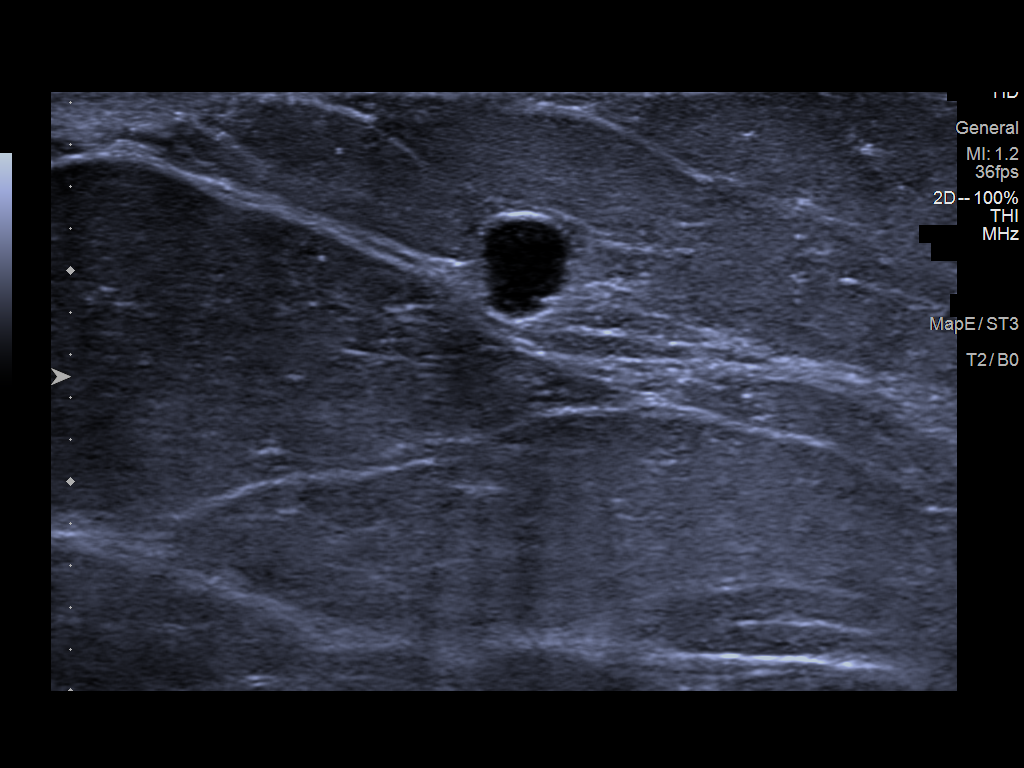
[im 3/4]
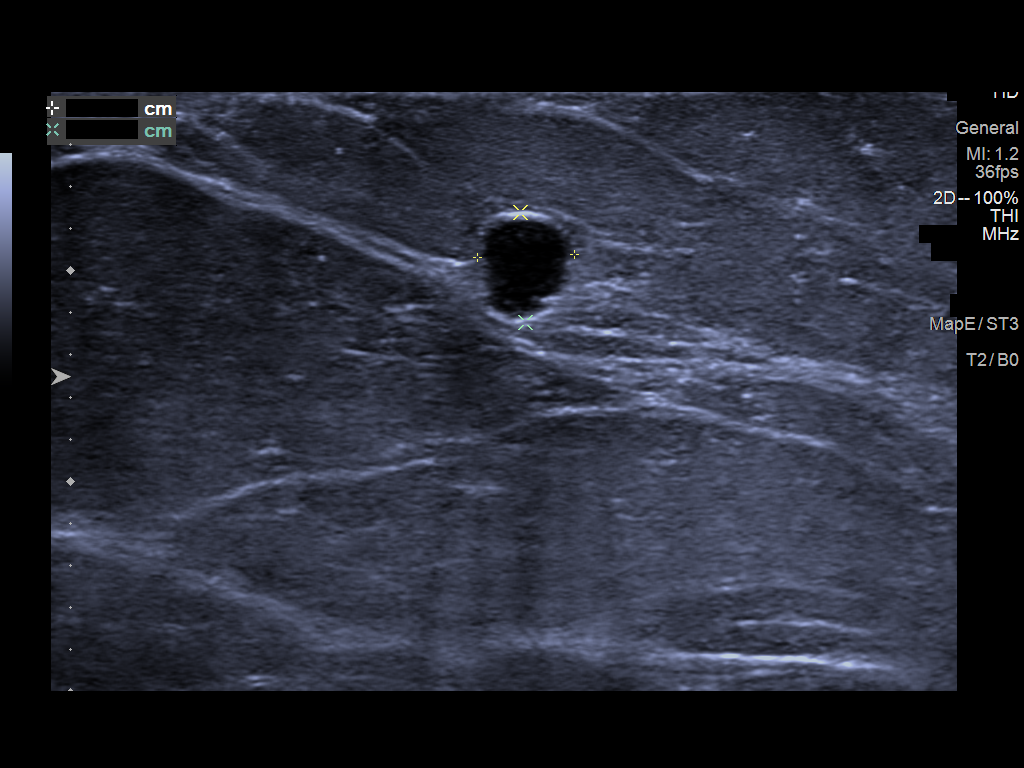
[im 4/4]
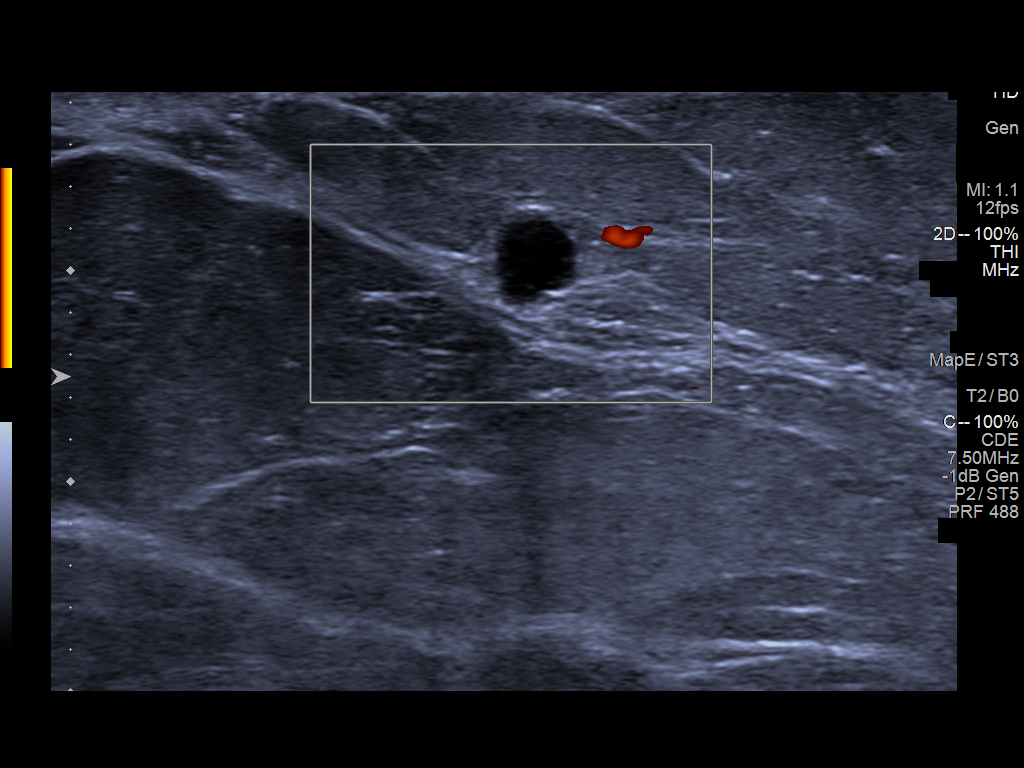

[4 of 4 positions shown; findings below may reference images not displayed]

ACR Breast Density Category b: There are scattered areas of
fibroglandular density.
FINDINGS: Cc and MLO views of bilateral breasts are submitted. Previously
noted mass in the subareolar lateral left breast is stable compared
prior exam. The right breast parenchyma is stable. No suspicious
abnormality is identified.

Targeted ultrasound is performed, showing probable minimal
complicated cyst with through transmission at the left breast 3
o'clock 3 cm from nipple measuring 5 x 5 x 5 mm unchanged compared
prior exam.
IMPRESSION: Probable benign findings.

RECOMMENDATION:
Bilateral diagnostic and left breast ultrasound in 12 months.

I have discussed the findings and recommendations with the patient.
If applicable, a reminder letter will be sent to the patient
regarding the next appointment.

BI-RADS CATEGORY  3: Probably benign.

## 2021-03-09 ENCOUNTER — Ambulatory Visit: Payer: Managed Care, Other (non HMO)

## 2021-03-11 ENCOUNTER — Ambulatory Visit
Admission: RE | Admit: 2021-03-11 | Discharge: 2021-03-11 | Disposition: A | Discharge status: Home / Self Care | Payer: Managed Care, Other (non HMO) | Source: Ambulatory Visit | Attending: Otolaryngology | Admitting: Otolaryngology

## 2021-03-11 DIAGNOSIS — R221 Localized swelling, mass and lump, neck: Secondary | ICD-10-CM

## 2021-03-11 DIAGNOSIS — G501 Atypical facial pain: Secondary | ICD-10-CM

## 2021-03-11 IMAGING — CT CT NECK W/ CM
5 of 6 series · 14 of 33 positions shown, 16 images · IV contrast (iopamidol)
Comparison: None.

CLINICAL DATA: Neck mass [06] ([06]-CM)Atypical face pain [06]
([06]-CM). Neck mass, atypical face pain, severe sore throat,
congestion, cough %

EXAM:
CT NECK WITH CONTRAST
TECHNIQUE: Multidetector CT imaging of the neck was performed using the
standard protocol following the bolus administration of intravenous
contrast.
CONTRAST:  75mL [06] IOPAMIDOL ([06]) INJECTION 61%

[Series 2: neck 2.00 br40 s3 st/ no angle · axial · 0.48mm/px · z∈[-738,-644]mm · 2 of 142 slices shown, 3 images]
[im 48/142  soft-tissue]
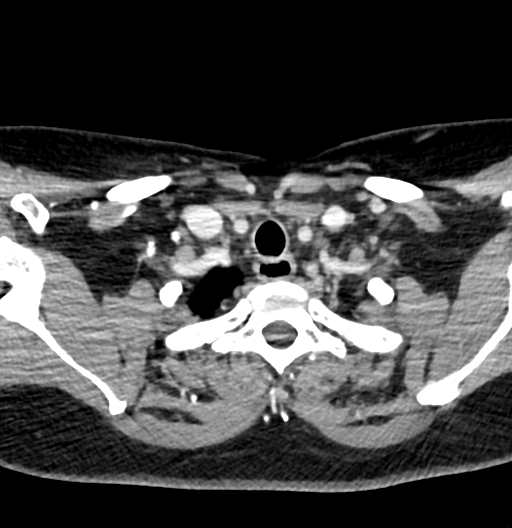
[im 48/142  bone]
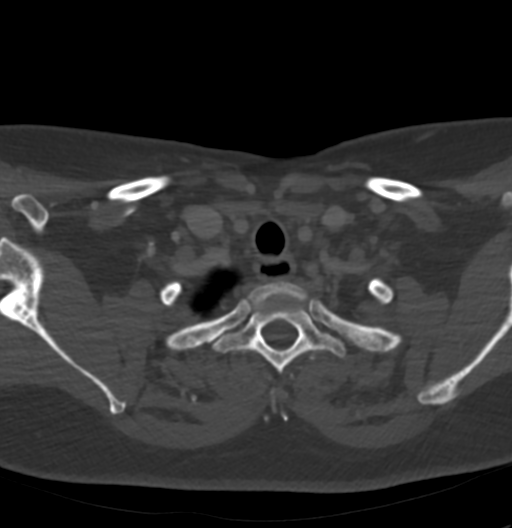
[im 95/142  bone]
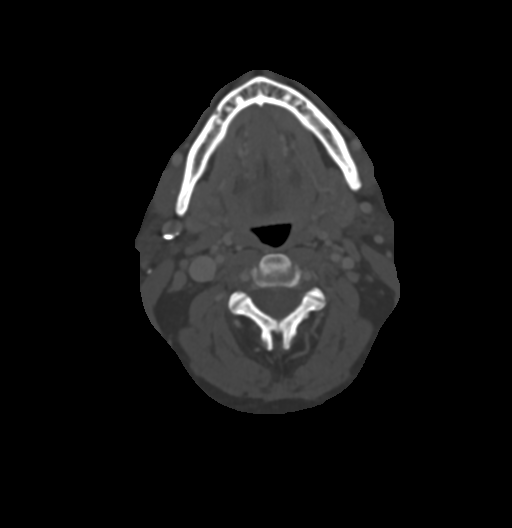

[Series 4: neck 2.00 br60 s3 bone/ no angle · axial · 0.48mm/px · z∈[-738,-644]mm · 2 of 142 slices shown]
[im 48/142  bone]
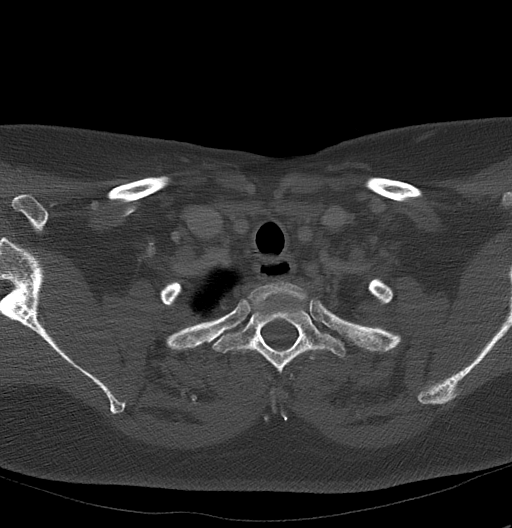
[im 95/142  bone]
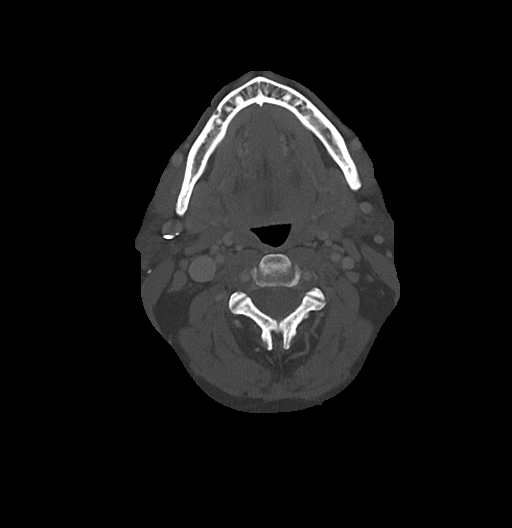

[Series 6: neck 2.00 br36 s3 angled axial (person_name) · axial · 0.48mm/px · z∈[-738,-644]mm · 2 of 142 slices shown]
[im 48/142  bone]
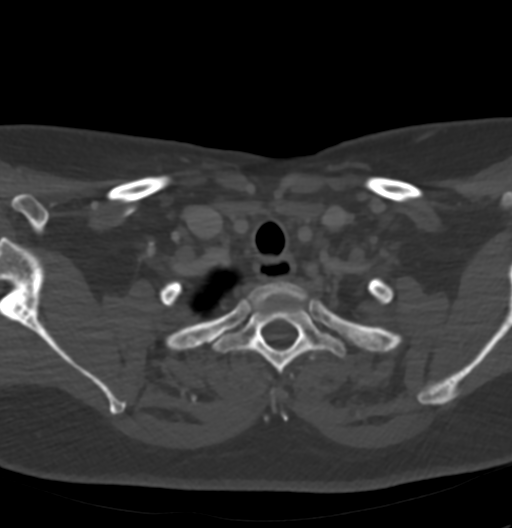
[im 95/142  bone]
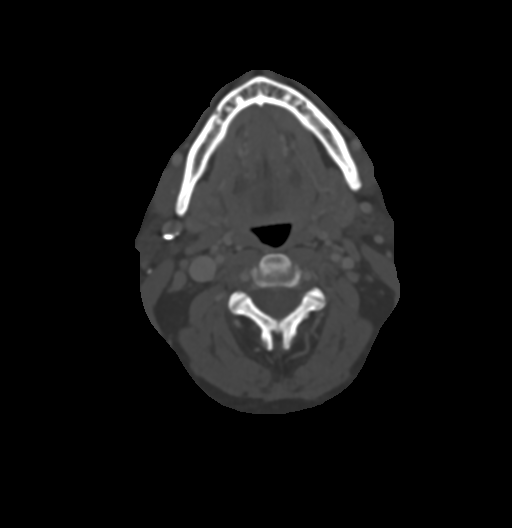

[Series 10: neck 2.00 br40 s3 (person_name) · coronal · 0.48mm/px · 3 of 146 slices shown (1 of 2)]
[im 30/146  bone]
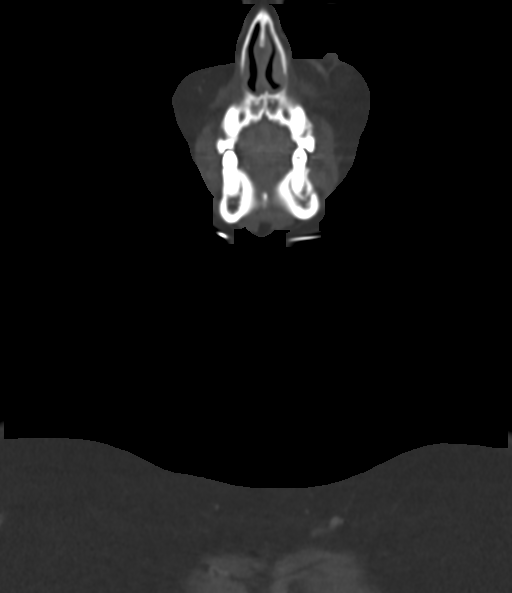
[im 59/146  bone]
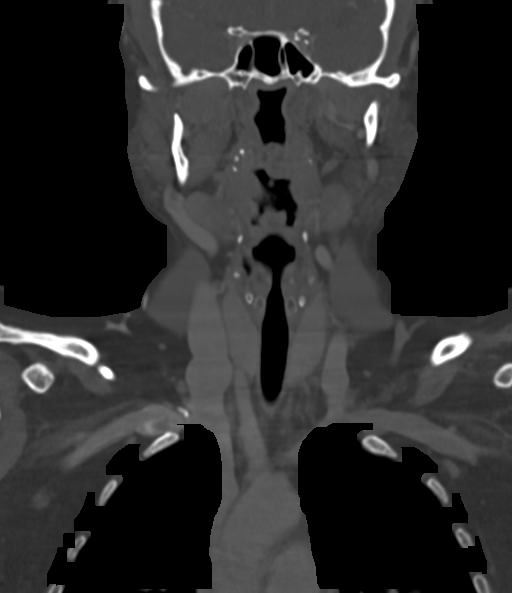
[im 88/146  bone]
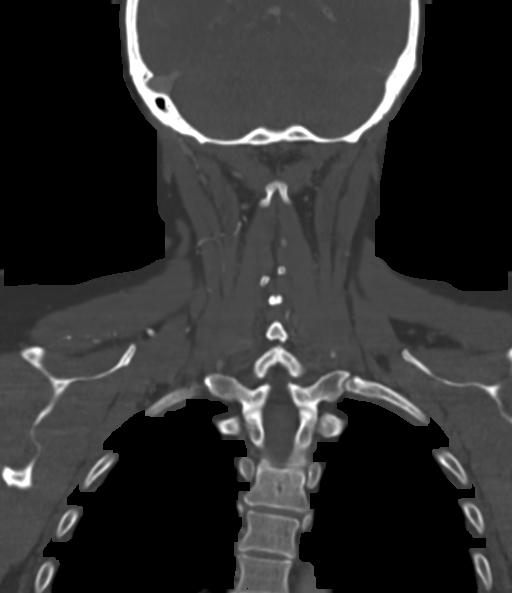

[Series 12: neck 2.00 br40 s3 (person_name) · sagittal · 0.50mm/px · 5 of 122 slices shown, 6 images (2 of 2)]
[im 41/122  bone]
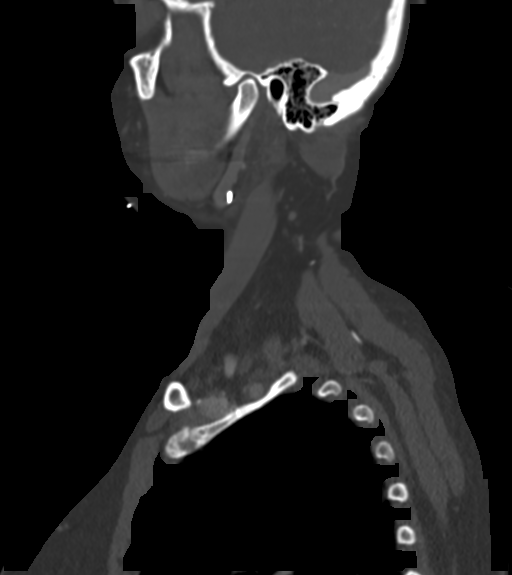
[im 51/122  bone]
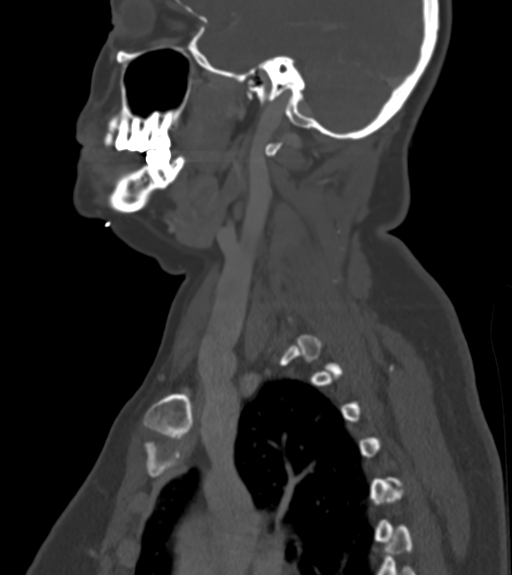
[im 61/122  soft-tissue]
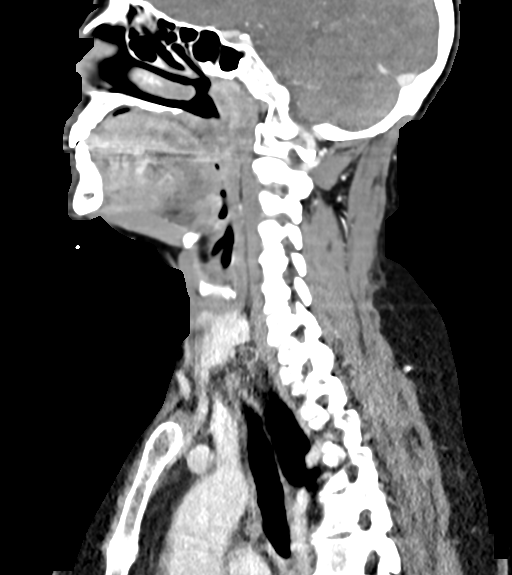
[im 61/122  bone]
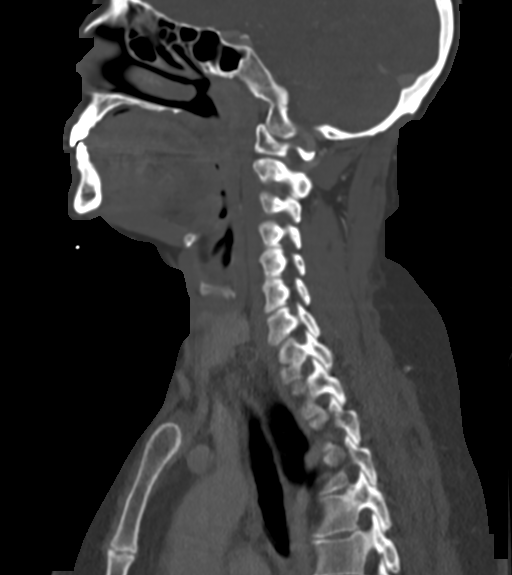
[im 71/122  bone]
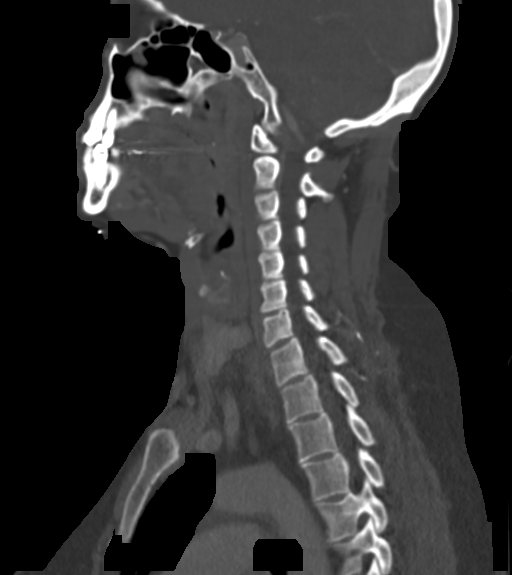
[im 81/122  bone]
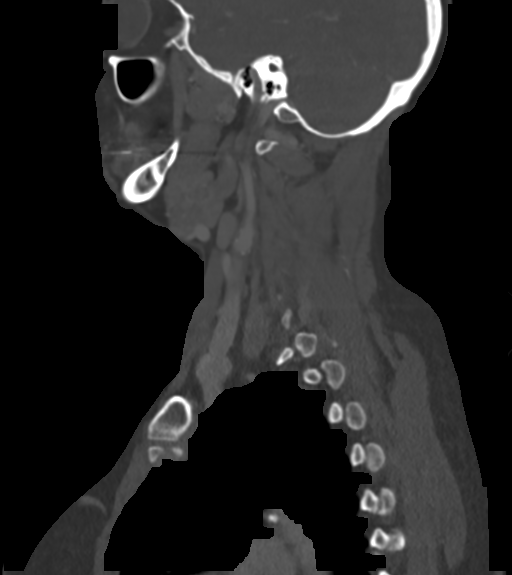

[14 of 33 positions shown; findings below may reference images not displayed]

FINDINGS: PHARYNX AND LARYNX: The nasopharynx, oropharynx and larynx are
normal. Visible portions of the oral cavity, tongue base and floor
of mouth are normal. Normal epiglottis, vallecula and pyriform
sinuses. The larynx is normal. No retropharyngeal abscess, effusion
or lymphadenopathy.

SALIVARY GLANDS: Normal parotid, submandibular and sublingual
glands.

THYROID: Normal.

LYMPH NODES: No enlarged or abnormal density lymph nodes.

VASCULAR: Major cervical vessels are patent.

LIMITED INTRACRANIAL: Normal.

VISUALIZED ORBITS: Normal.

MASTOIDS AND VISUALIZED PARANASAL SINUSES: No fluid levels or
advanced mucosal thickening. No mastoid effusion.

SKELETON: No bony spinal canal stenosis. No lytic or blastic
lesions.

UPPER CHEST: Clear.

OTHER: None.
IMPRESSION: Normal CT of the neck.

## 2021-03-11 MED ORDER — IOPAMIDOL (ISOVUE-300) INJECTION 61%
75.0000 mL | Freq: Once | INTRAVENOUS | Status: AC | PRN
  Administered 2021-03-11: 75 mL via INTRAVENOUS

## 2022-08-24 ENCOUNTER — Other Ambulatory Visit: Payer: Self-pay | Admitting: Otolaryngology

## 2022-08-24 DIAGNOSIS — H9312 Tinnitus, left ear: Secondary | ICD-10-CM

## 2022-09-16 ENCOUNTER — Other Ambulatory Visit: Payer: Managed Care, Other (non HMO)

## 2022-09-28 ENCOUNTER — Ambulatory Visit
Admission: RE | Admit: 2022-09-28 | Discharge: 2022-09-28 | Disposition: A | Discharge status: Home / Self Care | Payer: Managed Care, Other (non HMO) | Source: Ambulatory Visit | Attending: Otolaryngology | Admitting: Otolaryngology

## 2022-09-28 DIAGNOSIS — H9312 Tinnitus, left ear: Secondary | ICD-10-CM

## 2022-09-28 MED ORDER — GADOPICLENOL 0.5 MMOL/ML IV SOLN
7.5000 mL | Freq: Once | INTRAVENOUS | Status: AC | PRN
  Administered 2022-09-28: 7.5 mL via INTRAVENOUS

## 2022-11-26 ENCOUNTER — Encounter: Payer: Self-pay | Admitting: Family Medicine

## 2022-11-29 ENCOUNTER — Other Ambulatory Visit: Payer: Self-pay | Admitting: Family Medicine

## 2022-11-29 DIAGNOSIS — N63 Unspecified lump in unspecified breast: Secondary | ICD-10-CM

## 2022-12-07 ENCOUNTER — Ambulatory Visit: Payer: Managed Care, Other (non HMO) | Admitting: Dermatology

## 2023-01-07 ENCOUNTER — Other Ambulatory Visit: Payer: Managed Care, Other (non HMO)

## 2023-06-09 ENCOUNTER — Other Ambulatory Visit: Payer: Self-pay

## 2023-06-09 ENCOUNTER — Emergency Department: Payer: Managed Care, Other (non HMO)

## 2023-06-09 ENCOUNTER — Emergency Department
Admission: EM | Admit: 2023-06-09 | Discharge: 2023-06-09 | Disposition: A | Discharge status: Home / Self Care | Payer: Managed Care, Other (non HMO) | Attending: Emergency Medicine | Admitting: Emergency Medicine

## 2023-06-09 ENCOUNTER — Encounter: Payer: Self-pay | Admitting: Intensive Care

## 2023-06-09 DIAGNOSIS — J189 Pneumonia, unspecified organism: Secondary | ICD-10-CM | POA: Insufficient documentation

## 2023-06-09 DIAGNOSIS — Z20822 Contact with and (suspected) exposure to covid-19: Secondary | ICD-10-CM | POA: Diagnosis not present

## 2023-06-09 DIAGNOSIS — J069 Acute upper respiratory infection, unspecified: Secondary | ICD-10-CM | POA: Insufficient documentation

## 2023-06-09 DIAGNOSIS — R509 Fever, unspecified: Secondary | ICD-10-CM | POA: Diagnosis present

## 2023-06-09 LAB — RESP PANEL BY RT-PCR (RSV, FLU A&B, COVID)  RVPGX2
Influenza A by PCR: NEGATIVE
Influenza B by PCR: NEGATIVE
Resp Syncytial Virus by PCR: NEGATIVE
SARS Coronavirus 2 by RT PCR: NEGATIVE

## 2023-06-09 LAB — CBC WITH DIFFERENTIAL/PLATELET
Abs Immature Granulocytes: 0.02 10*3/uL (ref 0.00–0.07)
Basophils Absolute: 0 10*3/uL (ref 0.0–0.1)
Basophils Relative: 0 %
Eosinophils Absolute: 0 10*3/uL (ref 0.0–0.5)
Eosinophils Relative: 0 %
HCT: 34.8 % — ABNORMAL LOW (ref 36.0–46.0)
Hemoglobin: 11.7 g/dL — ABNORMAL LOW (ref 12.0–15.0)
Immature Granulocytes: 0 %
Lymphocytes Relative: 10 %
Lymphs Abs: 0.6 10*3/uL — ABNORMAL LOW (ref 0.7–4.0)
MCH: 29 pg (ref 26.0–34.0)
MCHC: 33.6 g/dL (ref 30.0–36.0)
MCV: 86.1 fL (ref 80.0–100.0)
Monocytes Absolute: 0.3 10*3/uL (ref 0.1–1.0)
Monocytes Relative: 6 %
Neutro Abs: 4.8 10*3/uL (ref 1.7–7.7)
Neutrophils Relative %: 84 %
Platelets: 171 10*3/uL (ref 150–400)
RBC: 4.04 MIL/uL (ref 3.87–5.11)
RDW: 14.6 % (ref 11.5–15.5)
WBC: 5.8 10*3/uL (ref 4.0–10.5)
nRBC: 0 % (ref 0.0–0.2)

## 2023-06-09 LAB — COMPREHENSIVE METABOLIC PANEL
ALT: 21 U/L (ref 0–44)
AST: 7 U/L — ABNORMAL LOW (ref 15–41)
Albumin: 3.5 g/dL (ref 3.5–5.0)
Alkaline Phosphatase: 53 U/L (ref 38–126)
Anion gap: 10 (ref 5–15)
BUN: 6 mg/dL (ref 6–20)
CO2: 23 mmol/L (ref 22–32)
Calcium: 8.5 mg/dL — ABNORMAL LOW (ref 8.9–10.3)
Chloride: 99 mmol/L (ref 98–111)
Creatinine, Ser: 0.6 mg/dL (ref 0.44–1.00)
GFR, Estimated: >60 mL/min (ref >60)
Glucose, Bld: 110 mg/dL — ABNORMAL HIGH (ref 70–99)
Potassium: 3.4 mmol/L — ABNORMAL LOW (ref 3.5–5.1)
Sodium: 132 mmol/L — ABNORMAL LOW (ref 135–145)
Total Bilirubin: 0.3 mg/dL (ref <1.2)
Total Protein: 6.6 g/dL (ref 6.5–8.1)

## 2023-06-09 LAB — URINALYSIS, ROUTINE W REFLEX MICROSCOPIC
Bilirubin Urine: NEGATIVE
Glucose, UA: NEGATIVE mg/dL
Ketones, ur: NEGATIVE mg/dL
Leukocytes,Ua: NEGATIVE
Nitrite: NEGATIVE
Protein, ur: NEGATIVE mg/dL
Specific Gravity, Urine: 1.006 (ref 1.005–1.030)
pH: 7 (ref 5.0–8.0)

## 2023-06-09 MED ORDER — LEVOFLOXACIN 750 MG PO TABS: 750.0000 mg | ORAL_TABLET | Freq: Every day | ORAL | 0 refills | Status: AC

## 2023-06-09 MED ORDER — HYDROCOD POLI-CHLORPHE POLI ER 10-8 MG/5ML PO SUER
5.0000 mL | Freq: Once | ORAL | Status: AC
  Administered 2023-06-09: 5 mL via ORAL
  Filled 2023-06-09: qty 5

## 2023-06-09 MED ORDER — DIPHENHYDRAMINE HCL 25 MG PO CAPS
25.0000 mg | ORAL_CAPSULE | Freq: Once | ORAL | Status: AC
  Administered 2023-06-09: 25 mg via ORAL
  Filled 2023-06-09: qty 1

## 2023-06-09 MED ORDER — GUAIFENESIN-CODEINE 100-10 MG/5ML PO SOLN: 5.0000 mL | Freq: Four times a day (QID) | ORAL | 0 refills | Status: AC | PRN

## 2023-06-09 NOTE — ED Provider Notes (Signed)
[facility] Provider Note    Event Date/Time   First MD Initiated Contact with Patient 06/09/23 0848     (approximate)  History   Chief Complaint: Fever and Cough  HPI  Isabel Hines is a 57 y.o. female with a past medical anxiety, hyperlipidemia, presents to the emergency department for cough congestion and fever.  According to the patient for the past 5 to 6 days now she has been coughing with congestion.  Patient now states for the last few days she has been experiencing fever at times.  Patient did a virtual care visit and was called in a prescription for clindamycin given her penicillin allergy as well as did a drive-through COVID/flu test that was negative.  Patient denies any pain besides some mild central chest discomfort when coughing.  Has been using Tessalon Perles without relief.  Took Tylenol this morning temperature of 99.9 in the emergency department.  Physical Exam   Triage Vital Signs: ED Triage Vitals  Encounter Vitals Group     BP 06/09/23 0818 134/60     Systolic BP Percentile --      Diastolic BP Percentile --      Pulse Rate 06/09/23 0818 92     Resp 06/09/23 0818 20     Temp 06/09/23 0818 99.9 F (37.7 C)     Temp Source 06/09/23 0818 Oral     SpO2 06/09/23 0818 94 %     Weight 06/09/23 0819 157 lb (71.2 kg)     Height 06/09/23 0819 5\' 5"  (1.651 m)     Head Circumference --      Peak Flow --      Pain Score 06/09/23 0818 4     Pain Loc --      Pain Education --      Exclude from Growth Chart --     Most recent vital signs: Vitals:   06/09/23 0818  BP: 134/60  Pulse: 92  Resp: 20  Temp: 99.9 F (37.7 C)  SpO2: 94%    General: Awake, no distress.  Frequent cough. CV:  Good peripheral perfusion.  Regular rate and rhythm  Resp:  Normal effort.  Equal breath sounds bilaterally.  No obvious wheeze rales or rhonchi. Abd:  No distention.  Soft, nontender.  No rebound or guarding.  ED Results / Procedures / Treatments    RADIOLOGY  I have reviewed and interpreted chest x-ray images.  Patient appears to have patchy opacities in the right lung base.   MEDICATIONS ORDERED IN ED: Medications - No data to display   IMPRESSION / MDM / ASSESSMENT AND PLAN / ED COURSE  I reviewed the triage vital signs and the nursing notes.  Patient's presentation is most consistent with acute presentation with potential threat to life or bodily function.  Patient presents the emergency department for continued cough congestion now with fever recently as well.  Overall the patient appears well, no distress but she does have a frequent cough throughout examination.  Patient borderline febrile 99.9 otherwise reassuring vital signs.  Overall clear lung sounds no wheeze rales or rhonchi.  Given the patient's fever and continued cough we will obtain an x-ray of the chest to evaluate for pneumonia we will check labs and continue to closely monitor.  We will dose cough medication.  Patient states codeine allergy but states it just makes her itch denies any shortness of breath nausea vomiting or other symptoms.  States if she takes a Benadryl  with hydrocodone it causes her no symptoms.  States she has done this in the past without issue.  Patient's labs show reassuring CBC with a normal white blood cell count, overall reassuring chemistry.  Urinalysis shows no significant finding.  Small amount of blood we will send a urine culture as a precaution.  COVID/flu test is pending.  I have reviewed the chest x-ray images appear to show pneumonia in the right lung base awaiting radiology read.  Radiology confirms pneumonia mostly in the right lower lobe.  Patient continues to appear well.  Lab work reassuring normal white blood cell count.  COVID/flu/RSV is negative.  We will cover with Levaquin and discharged with a cough medication.  I discussed very strict return precautions for any worsening symptoms shortness of breath.  Patient agreeable to  plan.  FINAL CLINICAL IMPRESSION(S) / ED DIAGNOSES   Upper respiratory patient Pneumonia   Note:  This document was prepared using Dragon voice recognition software and may include unintentional dictation errors.   Minna Antis, MD 06/09/23 [phone]

## 2023-06-09 NOTE — ED Triage Notes (Signed)
Patient c/o fever since last Saturday 06/04/23. Cough present. Ambulatory into ER with no issues. Patient reports taking tylenol this AM

## 2023-06-10 LAB — URINE CULTURE: Culture: <10000 — AB

## 2023-11-04 ENCOUNTER — Other Ambulatory Visit: Payer: Self-pay | Admitting: Family Medicine

## 2023-11-04 DIAGNOSIS — F17200 Nicotine dependence, unspecified, uncomplicated: Secondary | ICD-10-CM

## 2023-11-04 DIAGNOSIS — E785 Hyperlipidemia, unspecified: Secondary | ICD-10-CM

## 2023-12-01 ENCOUNTER — Inpatient Hospital Stay: Admission: RE | Admit: 2023-12-01 | Source: Ambulatory Visit

## 2024-06-12 ENCOUNTER — Other Ambulatory Visit: Payer: Self-pay | Admitting: Emergency Medicine

## 2024-06-12 DIAGNOSIS — F17210 Nicotine dependence, cigarettes, uncomplicated: Secondary | ICD-10-CM

## 2024-06-12 DIAGNOSIS — Z122 Encounter for screening for malignant neoplasm of respiratory organs: Secondary | ICD-10-CM

## 2024-06-26 ENCOUNTER — Ambulatory Visit
Admission: RE | Admit: 2024-06-26 | Discharge: 2024-06-26 | Disposition: A | Source: Ambulatory Visit | Attending: Emergency Medicine

## 2024-06-26 DIAGNOSIS — F17210 Nicotine dependence, cigarettes, uncomplicated: Secondary | ICD-10-CM | POA: Insufficient documentation

## 2024-06-26 DIAGNOSIS — Z122 Encounter for screening for malignant neoplasm of respiratory organs: Secondary | ICD-10-CM | POA: Diagnosis present

## 2024-07-13 ENCOUNTER — Other Ambulatory Visit: Payer: Self-pay | Admitting: Emergency Medicine

## 2024-07-13 DIAGNOSIS — F17210 Nicotine dependence, cigarettes, uncomplicated: Secondary | ICD-10-CM

## 2024-07-13 DIAGNOSIS — Z122 Encounter for screening for malignant neoplasm of respiratory organs: Secondary | ICD-10-CM

## 2024-07-13 DIAGNOSIS — J449 Chronic obstructive pulmonary disease, unspecified: Secondary | ICD-10-CM
